# Patient Record
Sex: Female | Born: 1935 | Race: Black or African American | Hispanic: No | Smoking: Never smoker
Health system: Southern US, Community
[De-identification: ages and names within clinical notes are randomized; demographics above are authoritative.]

## PROBLEM LIST (undated history)

## (undated) DIAGNOSIS — I10 Essential (primary) hypertension: Secondary | ICD-10-CM

## (undated) HISTORY — PX: ABDOMINAL HYSTERECTOMY: SHX81

---

## 2000-10-23 ENCOUNTER — Ambulatory Visit (HOSPITAL_COMMUNITY): Admission: RE | Admit: 2000-10-23 | Discharge: 2000-10-23 | Payer: Self-pay | Admitting: Internal Medicine

## 2000-10-23 ENCOUNTER — Encounter: Payer: Self-pay | Admitting: Internal Medicine

## 2001-10-08 ENCOUNTER — Ambulatory Visit (HOSPITAL_COMMUNITY): Admission: RE | Admit: 2001-10-08 | Discharge: 2001-10-08 | Payer: Self-pay | Admitting: Ophthalmology

## 2003-06-21 ENCOUNTER — Ambulatory Visit (HOSPITAL_COMMUNITY): Admission: RE | Admit: 2003-06-21 | Discharge: 2003-06-21 | Payer: Self-pay | Admitting: Internal Medicine

## 2006-10-05 ENCOUNTER — Emergency Department (HOSPITAL_COMMUNITY): Admission: EM | Admit: 2006-10-05 | Discharge: 2006-10-05 | Payer: Self-pay | Admitting: Emergency Medicine

## 2007-03-28 ENCOUNTER — Emergency Department (HOSPITAL_COMMUNITY): Admission: EM | Admit: 2007-03-28 | Discharge: 2007-03-28 | Payer: Self-pay | Admitting: Emergency Medicine

## 2007-07-20 ENCOUNTER — Emergency Department (HOSPITAL_COMMUNITY): Admission: EM | Admit: 2007-07-20 | Discharge: 2007-07-20 | Payer: Self-pay | Admitting: Emergency Medicine

## 2008-09-23 ENCOUNTER — Encounter: Payer: Self-pay | Admitting: Orthopedic Surgery

## 2008-09-23 ENCOUNTER — Emergency Department (HOSPITAL_COMMUNITY): Admission: EM | Admit: 2008-09-23 | Discharge: 2008-09-23 | Payer: Self-pay | Admitting: Emergency Medicine

## 2008-10-23 ENCOUNTER — Encounter: Payer: Self-pay | Admitting: Orthopedic Surgery

## 2008-10-24 ENCOUNTER — Ambulatory Visit: Payer: Self-pay | Admitting: Orthopedic Surgery

## 2008-10-24 DIAGNOSIS — S92309A Fracture of unspecified metatarsal bone(s), unspecified foot, initial encounter for closed fracture: Secondary | ICD-10-CM | POA: Insufficient documentation

## 2008-11-22 ENCOUNTER — Ambulatory Visit: Payer: Self-pay | Admitting: Orthopedic Surgery

## 2010-01-03 ENCOUNTER — Emergency Department (HOSPITAL_COMMUNITY)
Admission: EM | Admit: 2010-01-03 | Discharge: 2010-01-03 | Payer: Self-pay | Source: Home / Self Care | Admitting: Emergency Medicine

## 2010-05-28 ENCOUNTER — Inpatient Hospital Stay (HOSPITAL_COMMUNITY)
Admission: EM | Admit: 2010-05-28 | Discharge: 2010-05-31 | DRG: 312 | Disposition: A | Discharge status: Home Health | Payer: Medicare Other | Attending: Internal Medicine | Admitting: Internal Medicine

## 2010-05-28 DIAGNOSIS — I10 Essential (primary) hypertension: Secondary | ICD-10-CM | POA: Diagnosis present

## 2010-05-28 DIAGNOSIS — R55 Syncope and collapse: Principal | ICD-10-CM | POA: Diagnosis present

## 2010-05-28 DIAGNOSIS — R0789 Other chest pain: Secondary | ICD-10-CM | POA: Diagnosis present

## 2010-05-28 LAB — URINALYSIS, ROUTINE W REFLEX MICROSCOPIC
Bilirubin Urine: NEGATIVE
Hgb urine dipstick: NEGATIVE
Nitrite: NEGATIVE
Protein, ur: NEGATIVE mg/dL
Urobilinogen, UA: 0.2 mg/dL (ref 0.0–1.0)

## 2010-05-28 LAB — DIFFERENTIAL
Eosinophils Absolute: 0 10*3/uL (ref 0.0–0.7)
Eosinophils Relative: 0 % (ref 0–5)
Lymphocytes Relative: 19 % (ref 12–46)
Lymphs Abs: 1.8 10*3/uL (ref 0.7–4.0)
Monocytes Absolute: 0.6 10*3/uL (ref 0.1–1.0)
Monocytes Relative: 7 % (ref 3–12)

## 2010-05-28 LAB — CBC
HCT: 35.6 % — ABNORMAL LOW (ref 36.0–46.0)
MCH: 30.9 pg (ref 26.0–34.0)
MCHC: 34.6 g/dL (ref 30.0–36.0)
MCV: 89.4 fL (ref 78.0–100.0)
Platelets: 174 10*3/uL (ref 150–400)
RDW: 13.6 % (ref 11.5–15.5)
WBC: 9.7 10*3/uL (ref 4.0–10.5)

## 2010-05-28 LAB — POCT CARDIAC MARKERS
CKMB, poc: 2 ng/mL (ref 1.0–8.0)
CKMB, poc: 1.2 ng/mL (ref 1.0–8.0)
Troponin i, poc: <0.05 ng/mL (ref 0.00–0.09)

## 2010-05-28 LAB — CARDIAC PANEL(CRET KIN+CKTOT+MB+TROPI)
CK, MB: 1.9 ng/mL (ref 0.3–4.0)
Relative Index: 0.9 (ref 0.0–2.5)
Total CK: 213 U/L — ABNORMAL HIGH (ref 7–177)

## 2010-05-28 LAB — HEPATIC FUNCTION PANEL
ALT: 13 U/L (ref 0–35)
Bilirubin, Direct: 0.1 mg/dL (ref 0.0–0.3)
Indirect Bilirubin: 0.6 mg/dL (ref 0.3–0.9)
Total Protein: 6.2 g/dL (ref 6.0–8.3)

## 2010-05-28 LAB — BASIC METABOLIC PANEL
BUN: 28 mg/dL — ABNORMAL HIGH (ref 6–23)
Creatinine, Ser: 1.26 mg/dL — ABNORMAL HIGH (ref 0.4–1.2)
GFR calc non Af Amer: 40 mL/min — ABNORMAL LOW (ref >60)
Glucose, Bld: 106 mg/dL — ABNORMAL HIGH (ref 70–99)
Potassium: 3.4 mEq/L — ABNORMAL LOW (ref 3.5–5.1)

## 2010-05-29 LAB — CARDIAC PANEL(CRET KIN+CKTOT+MB+TROPI)
CK, MB: 2.1 ng/mL (ref 0.3–4.0)
Relative Index: 0.8 (ref 0.0–2.5)
Total CK: 257 U/L — ABNORMAL HIGH (ref 7–177)
Troponin I: <0.01 ng/mL (ref 0.00–0.06)

## 2010-05-30 LAB — CBC
Hemoglobin: 10.8 g/dL — ABNORMAL LOW (ref 12.0–15.0)
MCHC: 34.5 g/dL (ref 30.0–36.0)
RBC: 3.49 MIL/uL — ABNORMAL LOW (ref 3.87–5.11)
WBC: 5 10*3/uL (ref 4.0–10.5)

## 2010-05-30 LAB — BASIC METABOLIC PANEL
Calcium: 8.6 mg/dL (ref 8.4–10.5)
Creatinine, Ser: 0.94 mg/dL (ref 0.4–1.2)
GFR calc Af Amer: >60 mL/min (ref >60)
GFR calc non Af Amer: 56 mL/min — ABNORMAL LOW (ref >60)

## 2010-05-30 LAB — DIFFERENTIAL
Basophils Absolute: 0 10*3/uL (ref 0.0–0.1)
Basophils Relative: 0 % (ref 0–1)
Monocytes Absolute: 0.4 10*3/uL (ref 0.1–1.0)
Neutro Abs: 2.3 10*3/uL (ref 1.7–7.7)
Neutrophils Relative %: 47 % (ref 43–77)

## 2010-06-07 NOTE — Consult Note (Signed)
  [name], [name]                ACCOUNT NO.:  [id number]  MEDICAL RECORD NO.:  [id number]          PATIENT TYPE:  INP  LOCATION:  A311                          FACILITY:  [facility]  PHYSICIAN:  Jiali Linney A. Gerilyn Pilgrim, M.D. DATE OF BIRTH:  May 09, 1919  DATE OF CONSULTATION: DATE OF DISCHARGE:                                CONSULTATION   REASON FOR CONSULTATION:  Syncope.  This is an 77 year old black female who has had a couple spells of syncope or near syncope.  The patient reports one event to me, but apparently when the daughter came in, there is another event which happened yesterday which was the reason why she was taken to the hospital.  The first event occurred about a week ago.  The patient reports that she was going to the bathroom at night and after getting up, she felt so lightheaded, everything became widened, and she could not see.  She subsequently fell to the ground and could not get up.  It is unclear how long she was out, possibly a few minutes.  She crawled to the telephone to get some help.  No clear focal neurological deficits. No clonic-tonic activity.  No chest pain or shortness of breath.  She did report having some headaches in the last week or so although she does not have any headaches today.  The second event was witnessed by the daughter, she was sitting at the table, eating, and became unresponsive.  Eyes were open and eyes started rolling back and started breathing heavily, fell backwards.  She did not fall to the ground, however.  She was subsequently taken to the hospital for further evaluation.  The patient appears that the patient had been amnestic to the second event and also to some parts of the initial event.  PAST MEDICAL HISTORY:  Significant for foot fracture, hypertension.  MEDICATIONS:  Lisinopril, aspirin, clonidine, hydrochlorothiazide, and calcium.  ALLERGIES:  None known.  SOCIAL HISTORY:  Currently stays by herself.  Her granddaughter  and grandson stays with her.  The patient is very active, does all her ADLs, wash her own clothes, does her own cooking, and really takes everything by herself.  No tobacco, alcohol, or illicit drug use.  PAST SURGICAL HISTORY:  Cataract extraction procedure and hysterectomy.  FAMILY HISTORY:  Unremarkable.  REVIEW OF SYSTEMS:  As stated in the HPI, otherwise unremarkable.  PHYSICAL EXAMINATION:  GENERAL:  She is a thin pleasant lady, in no acute distress. VITAL SIGNS:  Temperature 98.5, pulse 64, respirations 16, blood pressure 138/81. HEENT EVALUATION:  Neck is supple.  Head is normocephalic, atraumatic. She does have mild esotropia on the left. MENTATION:  She is awake and alert.   Dictation ended at this point.     Traven Davids A. Gerilyn Pilgrim, M.D.     KAD/MEDQ  D:  05/29/2010  T:  05/30/2010  Job:  [id number]  Electronically Signed by Beryle Beams M.D. on 06/07/2010 02:52:23 PM

## 2010-06-07 NOTE — Progress Notes (Signed)
  [name], [name]                ACCOUNT NO.:  [id number]  MEDICAL RECORD NO.:  [id number]          PATIENT TYPE:  INP  LOCATION:  A311                          FACILITY:  [facility]  PHYSICIAN:  Lev Cervone A. Gerilyn Pilgrim, M.D. DATE OF BIRTH:  November 25, 1919  DATE OF PROCEDURE: DATE OF DISCHARGE:                                PROGRESS NOTE   No new complaints are reported.  No new recurrent symptoms.  She reports ambulating in the bedroom yesterday and feels good.  Temperature 98.1, pulse of 60, respirations 20, blood pressure 115/77.  HEENT evaluation, neck is supple.  Head is normocephalic, atraumatic.  Abdomen is soft. Extremities, no significant edema.  Mentation, she is awake and alert. She is lucid and coherent.  Motor examination shows she moves all 4 extremities well.  EEG is done and essentially is unremarkable without epileptiform discharges.  ASSESSMENT AND PLAN:  Recurrent spells of altered mentation and unresponsiveness/syncope, unclear etiology.  EEG done once is normal, although this did not entirely ruled out epileptic events, continue to follow the patient.     [name] A. Gerilyn Pilgrim, M.D.     KAD/MEDQ  D:  05/30/2010  T:  05/31/2010  Job:  [id number]  Electronically Signed by Beryle Beams M.D. on 06/07/2010 02:52:32 PM

## 2010-06-07 NOTE — Consult Note (Signed)
  [name], Larson                ACCOUNT NO.:  [id number]  MEDICAL RECORD NO.:  [id number]          PATIENT TYPE:  INP  LOCATION:  A311                          FACILITY:  [facility]  PHYSICIAN:  Latiana Tomei A. Gerilyn Pilgrim, M.D. DATE OF BIRTH:  November 11, 1919  DATE OF CONSULTATION: DATE OF DISCHARGE:                                CONSULTATION   ADDENDUM  ABDOMEN:  Soft. EXTREMITIES:  No edema. NEUROLOGIC:  Mentation, she is awake and alert.  Speech, language, and cognition are intact.  Cranial nerves evaluation shows right pupils to be 6 mm and minimally reactive.  She has a dense cataract there.  The left pupil is status post iridectomy.  Irregularly shaped, somewhat oval- shaped, less reactive, however.  Extraocular movements are intact. Visual fields are full.  Facial and muscle strength is symmetric. Tongue is midline.  Uvula midline.  Shoulder shrugs normal.  Motor examination shows mild proximal leg weakness bilaterally, greater than 4+.  Distal strength is good.  Upper extremity shows normal tone, bulk, and strength.  There is no pronator drift.  Coordination shows no dysmetria.  There are no tremors.  There is no parkinsonism.  Reflexes are preserved.  Plantars are both equivocal.  Sensation normal to light touch.  CT scan of brain shows atrophy and mild leukoencephalopathy.  Chest x- ray shows mild atelectasis on the left.  Labs evaluation showed sodium 141, potassium 3.4, chloride 100, CO2 of 32, BUN 28, creatinine 1.2, glucose 106.  Liver enzymes normal.  WBC 9.7, hemoglobin 12.3, platelet count of 174.  CPK was 213 and 237, troponin 0.01, MB fraction of 1.9.  Urinalysis negative.  ASSESSMENT:  Two spells of altered mentation and syncope/presyncope, unclear etiology.  The first includes a cardiac event and possibly unusual complex partial seizure presentation.  Recommendation, She has echocardiography, carotid duplex, Doppler, and telemetry all in progress.  Also cardiology is  pending previous workup.  I am also going to have EEG and follow the patient with you.  Thank you for this consultation.     [name] A. Gerilyn Pilgrim, M.D.    KAD/MEDQ  D:  05/29/2010  T:  05/29/2010  Job:  [id number]  Electronically Signed by Beryle Beams M.D. on 06/07/2010 02:52:16 PM

## 2010-06-13 NOTE — H&P (Signed)
  [name], [name]                ACCOUNT NO.:  [id number]  MEDICAL RECORD NO.:  [id number]          PATIENT TYPE:  INP  LOCATION:  A311                          FACILITY:  [facility]  PHYSICIAN:  Json Koelzer D. Felecia Shelling, MD   DATE OF BIRTH:  [birth year]  DATE OF ADMISSION:  05/28/2010 DATE OF DISCHARGE:  LH                             HISTORY & PHYSICAL   CHIEF COMPLAINTS:  Syncopal episode.  HISTORY OF PRESENT ILLNESS:  This is an 77 year old female patient with history of hypertension and parathyroid adenoma, who came to emergency room with above complaints.  The patient passed out and fell on the ground about 2 days back when she was at home.  The patient did not remember what happened during the episode.  Later, she had some chest discomfort which lasted for a few minutes.  She had no nausea, vomiting, or diaphoresis.  REVIEW OF SYSTEMS:  No headache, fever, chills, shortness of breath, nausea, vomiting, abdominal pain, dysuria, urgency or frequency of urination.  PAST MEDICAL HISTORY: 1. Hypertension. 2. History of adenoma of parathyroid gland and status post surgery.  CURRENT MEDICATIONS: 1. Clonidine 0.1 mg p.o. b.i.d. 2. Lisinopril/HCTZ 20/12.5 one tablet p.o. daily. 3. Calcitriol oral 600 mg p.o. daily.  SOCIAL HISTORY:  The patient is a widow.  She currently lives with her children.  No history of alcohol, tobacco, or substance abuse.  FAMILY HISTORY:  There is no significant family history that the patient can remember.  PHYSICAL EXAMINATION:  GENERAL:  The patient is alert, awake, and pleasant looking. VITAL SIGNS:  Blood pressure 165/94, pulse 87, respiratory rate 16, temperature 98.8 degrees Fahrenheit. HEENT:  Pupils are equal and reactive. NECK:  Supple. CHEST:  Clear lung fields.  Good air entry. CARDIOVASCULAR SYSTEM:  First and second heart sound heard.  No murmur, no gallop. ABDOMEN:  Soft and lax.  Bowel sounds positive.  No mass  or organomegaly. EXTREMITIES:  No leg edema.  LABS ON ADMISSION:  CBC; WBC 9.7, hemoglobin 12.3, hematocrit 36.5, platelets 174.  Sodium 141, potassium 3.4, chloride 100, carbon dioxide 32, glucose 106, BUN 28, creatinine 1.2, calcium 8.9.  Total bilirubin 0.7, alkaline phosphatase 38, AST 26, ALT 13, albumin 3.3, myoglobin 244, troponin less than 0.05, and CK-MB 2.0.  ASSESSMENT: 1. Syncopal episode, etiology not clear at this time. 2. History of chest pain to rule out myocardial infarction. 3. Hypertension. 4. History of parathyroid adenoma and status post surgery.  PLAN:  We will admit the patient under telemetry.  We will continue serial EKG and cardiac enzymes.  We will do carotid Doppler, echocardiogram, and Cardiology and Neurology consult.  We will continue the patient on her regular medications.  We will monitor orthostatic blood pressure.     Renn Stille D. Felecia Shelling, MD     TDF/MEDQ  D:  05/29/2010  T:  05/29/2010  Job:  [id number]  Electronically Signed by Avon Gully MD on 06/13/2010 08:19:11 AM

## 2010-07-09 NOTE — Discharge Summary (Signed)
  [name], [name]                ACCOUNT NO.:  [id number]  MEDICAL RECORD NO.:  [id number]           PATIENT TYPE:  I  LOCATION:  A311                          FACILITY:  [facility]  PHYSICIAN:  Kennadie Brenner D. Felecia Shelling, MD   DATE OF BIRTH:  06/04/1919  DATE OF ADMISSION:  05/28/2010 DATE OF DISCHARGE:  02/02/2012LH                              DISCHARGE SUMMARY   DISCHARGE DIAGNOSES: 1. Syncopal episode. 2. Hypertension. 3. History of parathyroid adenoma and status post surgery.  DISCHARGE MEDICATIONS: 1. Clonidine 0.1 mg p.o. b.i.d. 2. Lisinopril/hydrochlorothiazide 12/12.5 one tablet p.o. daily. 3. Calcitriol oral 600 mg p.o. daily.  DISPOSITION:  The patient will be discharged to home in stable condition.  DISCHARGE INSTRUCTIONS:  The patient is advised to continue her medications.  Advised on fall precaution.  She will be followed in the office.  HOSPITAL COURSE:  This is an 77 year old female patient with history of hypertension and parathyroid adenoma brought to emergency room due to syncopal episode and accidental fall.  She was admitted under telemetry and serial EKG and cardiac enzymes were done.  The patient was evaluated by Neurology.  She had her workup.  Her labs and other tests were within the normal limits.  The patient was able to ambulate without any assistance.  She was back to her baseline.  The patient was discharged to home in a stable condition.     [name] D. Felecia Shelling, MD     TDF/MEDQ  D:  07/05/2010  T:  07/05/2010  Job:  [id number]  Electronically Signed by Avon Gully MD on 07/09/2010 08:59:27 AM

## 2011-01-21 LAB — DIFFERENTIAL
Basophils Relative: 1
Lymphocytes Relative: 45
Monocytes Absolute: 0.3
Monocytes Relative: 8
Neutro Abs: 1.9

## 2011-01-21 LAB — URINALYSIS, ROUTINE W REFLEX MICROSCOPIC
Bilirubin Urine: NEGATIVE
Hgb urine dipstick: NEGATIVE
Specific Gravity, Urine: 1.01
pH: 8

## 2011-01-21 LAB — BASIC METABOLIC PANEL
CO2: 36 — ABNORMAL HIGH
Calcium: 9.5
GFR calc Af Amer: >60
Sodium: 139

## 2011-01-21 LAB — URINE MICROSCOPIC-ADD ON

## 2011-01-21 LAB — CBC
Hemoglobin: 12.6
MCHC: 34.4
RBC: 4.03

## 2011-02-14 LAB — COMPREHENSIVE METABOLIC PANEL WITH GFR
ALT: 14
AST: 26
Albumin: 3.3 — ABNORMAL LOW
Alkaline Phosphatase: 37 — ABNORMAL LOW
BUN: 9
CO2: 35 — ABNORMAL HIGH
Calcium: 9.3
Chloride: 99
Creatinine, Ser: 0.86
GFR calc non Af Amer: >60
Glucose, Bld: 115 — ABNORMAL HIGH
Potassium: 3.1 — ABNORMAL LOW
Sodium: 138
Total Bilirubin: 0.4
Total Protein: 6.3

## 2011-02-14 LAB — DIFFERENTIAL
Basophils Relative: 1
Eosinophils Absolute: 0
Lymphs Abs: 1.7
Monocytes Absolute: 0.4
Neutrophils Relative %: 52

## 2011-02-14 LAB — CBC
HCT: 34.2 — ABNORMAL LOW
Hemoglobin: 12
MCHC: 35
MCV: 89.2
Platelets: 214
RBC: 3.84 — ABNORMAL LOW
RDW: 13
WBC: 4.4

## 2011-02-14 LAB — URINE MICROSCOPIC-ADD ON

## 2011-02-14 LAB — URINE CULTURE: Colony Count: >=100000

## 2011-02-14 LAB — URINALYSIS, ROUTINE W REFLEX MICROSCOPIC
Bilirubin Urine: NEGATIVE
Glucose, UA: NEGATIVE
Hgb urine dipstick: NEGATIVE
Specific Gravity, Urine: 1.015
Urobilinogen, UA: 0.2

## 2011-02-14 LAB — POCT CARDIAC MARKERS
CKMB, poc: 1.5
Myoglobin, poc: 59.1
Operator id: 240821
Troponin i, poc: <0.05

## 2011-04-22 ENCOUNTER — Emergency Department (HOSPITAL_COMMUNITY): Payer: Medicare Other

## 2011-04-22 ENCOUNTER — Other Ambulatory Visit: Payer: Self-pay

## 2011-04-22 ENCOUNTER — Emergency Department (HOSPITAL_COMMUNITY)
Admission: EM | Admit: 2011-04-22 | Discharge: 2011-04-22 | Disposition: A | Discharge status: Home / Self Care | Payer: Medicare Other | Attending: Emergency Medicine | Admitting: Emergency Medicine

## 2011-04-22 DIAGNOSIS — R1110 Vomiting, unspecified: Secondary | ICD-10-CM | POA: Insufficient documentation

## 2011-04-22 DIAGNOSIS — R109 Unspecified abdominal pain: Secondary | ICD-10-CM | POA: Insufficient documentation

## 2011-04-22 DIAGNOSIS — F29 Unspecified psychosis not due to a substance or known physiological condition: Secondary | ICD-10-CM | POA: Insufficient documentation

## 2011-04-22 DIAGNOSIS — R55 Syncope and collapse: Secondary | ICD-10-CM

## 2011-04-22 DIAGNOSIS — Z136 Encounter for screening for cardiovascular disorders: Secondary | ICD-10-CM | POA: Insufficient documentation

## 2011-04-22 DIAGNOSIS — Z7982 Long term (current) use of aspirin: Secondary | ICD-10-CM | POA: Insufficient documentation

## 2011-04-22 DIAGNOSIS — I10 Essential (primary) hypertension: Secondary | ICD-10-CM | POA: Insufficient documentation

## 2011-04-22 HISTORY — DX: Essential (primary) hypertension: I10

## 2011-04-22 MED ORDER — ONDANSETRON 4 MG PO TBDP
4.0000 mg | ORAL_TABLET | Freq: Once | ORAL | Status: AC
  Administered 2011-04-22: 4 mg via ORAL
  Filled 2011-04-22: qty 1

## 2011-04-22 MED ORDER — ONDANSETRON HCL 4 MG PO TABS: 4.0000 mg | ORAL_TABLET | Freq: Four times a day (QID) | ORAL | Status: AC

## 2011-04-22 NOTE — ED Provider Notes (Signed)
This chart was scribed for Ward Givens, MD by Williemae Natter. The patient was seen in room APA18/AP[facility]18 and the patient's care was started at 10:12 PM.  CSN: [id number]  Arrival date & time 04/22/11  2059   First MD Initiated Contact with Patient 04/22/11 2135      Chief Complaint  Patient presents with  . Emesis  . Weakness    (Consider location/radiation/quality/duration/timing/severity/associated sxs/prior treatment) HPI 10:12 PM [name] is a 77 y.o. female who presents to the Emergency Department .  Family states they just had Christmas dinner and patient had acute onset of vomiting. She had 2 episodes of vomiting. She was trying to walk to the bathroom to start vomiting and her daughter states she passed out for 2-3 seconds. She did not have any injury from falling. She indicates she had periumbilical, pain earlier when she had to throw up however that is now gone. She denies any diarrhea  No one else is sick at home. They report she's had syncope in the past however she was not ill with those episodes.  PCP Dr. Felecia Shelling  Past Medical History  Diagnosis Date  . Hypertension     Past Surgical History  Procedure Date  . Abdominal hysterectomy     No family history on file.  History  Substance Use Topics  . Smoking status: Never Smoker   . Smokeless tobacco: Not on file  . Alcohol Use: No   lives with grandson  OB History    Grav Para Term Preterm Abortions TAB SAB Ect Mult Living                  Review of Systems  All other systems reviewed and are negative.   10 Systems reviewed and are negative for acute change except as noted in the HPI.  Allergies  Review of patient's allergies indicates no known allergies.  Home Medications   Current Outpatient Rx  Name Route Sig Dispense Refill  . ASPIRIN EC 81 MG PO TBEC Oral Take 81 mg by mouth daily.      Marland Kitchen CALCIUM 600 + D PO Oral Take 1 tablet by mouth daily.      Marland Kitchen LISINOPRIL PO Oral Take 1 tablet by  mouth daily.        BP 106/65  Pulse 107  Temp(Src) 98.3 F (36.8 C) (Oral)  Resp 18  Ht 5\' 4"  (1.626 m)  Wt 133 lb (60.328 kg)  BMI 22.83 kg/m2  SpO2 99% Vital signs show mild tachycardia otherwise normal  Physical Exam  Nursing note and vitals reviewed. Constitutional: She appears well-developed and well-nourished. No distress.  HENT:  Head: Normocephalic and atraumatic.  Right Ear: External ear normal.  Left Ear: External ear normal.  Nose: Nose normal.  Mouth/Throat: Oropharynx is clear and moist.  Eyes: Conjunctivae are normal. Pupils are equal, round, and reactive to light.  Neck: Normal range of motion. Neck supple.  Cardiovascular: Normal rate and regular rhythm.   Pulmonary/Chest: Effort normal and breath sounds normal.  Abdominal: Soft. She exhibits no distension and no mass. There is no tenderness. There is no rebound and no guarding.       Patient has very active bowel sounds  Neurological: She is alert.       Patient appears to have some mild confusion  Skin: Skin is warm and dry.  Psychiatric: She has a normal mood and affect. Her behavior is normal.    ED Course  Procedures (including  critical care time)  Patient given Zofran ODT. When I rechecked her at 2310 she relates she's feeling much improved. However she did not want to drink and her daughter did not want her to drink before she leaves to make her she's okay.    Dg Abd Acute W/chest  04/22/2011  *RADIOLOGY REPORT*  Clinical Data: Abdominal pain and vomiting; weakness.  ACUTE ABDOMEN SERIES (ABDOMEN 2 VIEW & CHEST 1 VIEW)  Comparison: None.  Findings: Left basilar atelectasis is noted, reflecting elevation of the left hemidiaphragm.  There is no evidence of pleural effusion or pneumothorax.  The cardiomediastinal silhouette is normal in size, though shifted to the right due to the elevated left hemidiaphragm.  The stomach is filled with fluid and air.  The visualized bowel gas pattern is otherwise  unremarkable.  Stool and air are noted throughout the colon; there is no evidence of small bowel dilatation to suggest obstruction.  No free intra-abdominal air is identified on the provided upright view.  No acute osseous abnormalities are seen; the sacroiliac joints are unremarkable in appearance.  IMPRESSION:  1.  Elevation of the left hemidiaphragm; the stomach is mildly distended with fluid and air. 2.  Otherwise unremarkable bowel gas pattern; no free intra- abdominal air seen. 3.  Left basilar atelectasis; lungs otherwise grossly clear.  Original Report Authenticated By: Tonia Ghent, M.D.     Date: 04/22/2011  Rate: 89  Rhythm: normal sinus rhythm  QRS Axis: normal  Intervals: normal  ST/T Wave abnormalities: repolarization abnormality  Conduction Disutrbances:LVH  Narrative Interpretation:   Old EKG Reviewed: unchanged from 05/29/2010      I personally performed the services described in this documentation, which was scribed in my presence. The recorded information has been reviewed and considered. Devoria Albe, MD, FACEP   MDM          Ward Givens, MD 04/22/11 [phone]

## 2011-04-22 NOTE — ED Notes (Signed)
Pt brought in by family for vomiting and weakness x 2 hours.

## 2011-06-18 DIAGNOSIS — Z961 Presence of intraocular lens: Secondary | ICD-10-CM | POA: Diagnosis not present

## 2011-06-18 DIAGNOSIS — H348192 Central retinal vein occlusion, unspecified eye, stable: Secondary | ICD-10-CM | POA: Diagnosis not present

## 2011-06-18 DIAGNOSIS — H4310 Vitreous hemorrhage, unspecified eye: Secondary | ICD-10-CM | POA: Diagnosis not present

## 2011-06-19 ENCOUNTER — Encounter (INDEPENDENT_AMBULATORY_CARE_PROVIDER_SITE_OTHER): Payer: Medicare Other | Admitting: Ophthalmology

## 2011-06-19 DIAGNOSIS — H348192 Central retinal vein occlusion, unspecified eye, stable: Secondary | ICD-10-CM

## 2011-06-19 DIAGNOSIS — I10 Essential (primary) hypertension: Secondary | ICD-10-CM

## 2011-06-19 DIAGNOSIS — H4010X0 Unspecified open-angle glaucoma, stage unspecified: Secondary | ICD-10-CM | POA: Diagnosis not present

## 2011-06-19 DIAGNOSIS — H43819 Vitreous degeneration, unspecified eye: Secondary | ICD-10-CM

## 2011-06-19 DIAGNOSIS — H35039 Hypertensive retinopathy, unspecified eye: Secondary | ICD-10-CM | POA: Diagnosis not present

## 2011-06-19 DIAGNOSIS — Q120 Congenital cataract: Secondary | ICD-10-CM

## 2011-06-24 ENCOUNTER — Ambulatory Visit (INDEPENDENT_AMBULATORY_CARE_PROVIDER_SITE_OTHER): Payer: Medicare Other | Admitting: Ophthalmology

## 2011-06-24 DIAGNOSIS — H348192 Central retinal vein occlusion, unspecified eye, stable: Secondary | ICD-10-CM | POA: Diagnosis not present

## 2011-06-24 DIAGNOSIS — H35039 Hypertensive retinopathy, unspecified eye: Secondary | ICD-10-CM

## 2011-06-24 DIAGNOSIS — H4310 Vitreous hemorrhage, unspecified eye: Secondary | ICD-10-CM

## 2011-06-24 DIAGNOSIS — I10 Essential (primary) hypertension: Secondary | ICD-10-CM

## 2011-06-24 DIAGNOSIS — H3530 Unspecified macular degeneration: Secondary | ICD-10-CM

## 2011-06-24 DIAGNOSIS — H43819 Vitreous degeneration, unspecified eye: Secondary | ICD-10-CM

## 2011-07-16 ENCOUNTER — Encounter (INDEPENDENT_AMBULATORY_CARE_PROVIDER_SITE_OTHER): Payer: Medicare Other | Admitting: Ophthalmology

## 2011-07-16 DIAGNOSIS — I10 Essential (primary) hypertension: Secondary | ICD-10-CM

## 2011-07-16 DIAGNOSIS — H2510 Age-related nuclear cataract, unspecified eye: Secondary | ICD-10-CM

## 2011-07-16 DIAGNOSIS — H348192 Central retinal vein occlusion, unspecified eye, stable: Secondary | ICD-10-CM | POA: Diagnosis not present

## 2011-07-16 DIAGNOSIS — H35039 Hypertensive retinopathy, unspecified eye: Secondary | ICD-10-CM | POA: Diagnosis not present

## 2011-07-16 DIAGNOSIS — H43819 Vitreous degeneration, unspecified eye: Secondary | ICD-10-CM | POA: Diagnosis not present

## 2011-08-13 ENCOUNTER — Encounter (INDEPENDENT_AMBULATORY_CARE_PROVIDER_SITE_OTHER): Payer: Medicare Other | Admitting: Ophthalmology

## 2011-08-13 DIAGNOSIS — H3530 Unspecified macular degeneration: Secondary | ICD-10-CM | POA: Diagnosis not present

## 2011-08-13 DIAGNOSIS — I10 Essential (primary) hypertension: Secondary | ICD-10-CM

## 2011-08-13 DIAGNOSIS — H43819 Vitreous degeneration, unspecified eye: Secondary | ICD-10-CM

## 2011-08-13 DIAGNOSIS — H348192 Central retinal vein occlusion, unspecified eye, stable: Secondary | ICD-10-CM

## 2011-08-13 DIAGNOSIS — H35039 Hypertensive retinopathy, unspecified eye: Secondary | ICD-10-CM

## 2011-08-26 DIAGNOSIS — I10 Essential (primary) hypertension: Secondary | ICD-10-CM | POA: Diagnosis not present

## 2011-09-09 ENCOUNTER — Encounter (INDEPENDENT_AMBULATORY_CARE_PROVIDER_SITE_OTHER): Payer: Medicare Other | Admitting: Ophthalmology

## 2011-09-09 DIAGNOSIS — I10 Essential (primary) hypertension: Secondary | ICD-10-CM

## 2011-09-09 DIAGNOSIS — H43819 Vitreous degeneration, unspecified eye: Secondary | ICD-10-CM

## 2011-09-09 DIAGNOSIS — H35039 Hypertensive retinopathy, unspecified eye: Secondary | ICD-10-CM

## 2011-09-09 DIAGNOSIS — H348192 Central retinal vein occlusion, unspecified eye, stable: Secondary | ICD-10-CM | POA: Diagnosis not present

## 2011-09-09 DIAGNOSIS — H2510 Age-related nuclear cataract, unspecified eye: Secondary | ICD-10-CM

## 2011-10-07 ENCOUNTER — Encounter (INDEPENDENT_AMBULATORY_CARE_PROVIDER_SITE_OTHER): Payer: Medicare Other | Admitting: Ophthalmology

## 2011-10-07 DIAGNOSIS — H348192 Central retinal vein occlusion, unspecified eye, stable: Secondary | ICD-10-CM | POA: Diagnosis not present

## 2011-10-07 DIAGNOSIS — H35039 Hypertensive retinopathy, unspecified eye: Secondary | ICD-10-CM | POA: Diagnosis not present

## 2011-10-07 DIAGNOSIS — H2510 Age-related nuclear cataract, unspecified eye: Secondary | ICD-10-CM

## 2011-10-07 DIAGNOSIS — H43819 Vitreous degeneration, unspecified eye: Secondary | ICD-10-CM | POA: Diagnosis not present

## 2011-10-07 DIAGNOSIS — I10 Essential (primary) hypertension: Secondary | ICD-10-CM

## 2011-11-11 ENCOUNTER — Encounter (INDEPENDENT_AMBULATORY_CARE_PROVIDER_SITE_OTHER): Payer: Medicare Other | Admitting: Ophthalmology

## 2011-11-11 DIAGNOSIS — I10 Essential (primary) hypertension: Secondary | ICD-10-CM

## 2011-11-11 DIAGNOSIS — H2510 Age-related nuclear cataract, unspecified eye: Secondary | ICD-10-CM

## 2011-11-11 DIAGNOSIS — H348192 Central retinal vein occlusion, unspecified eye, stable: Secondary | ICD-10-CM

## 2011-11-11 DIAGNOSIS — H35039 Hypertensive retinopathy, unspecified eye: Secondary | ICD-10-CM

## 2011-11-11 DIAGNOSIS — H43819 Vitreous degeneration, unspecified eye: Secondary | ICD-10-CM

## 2011-11-18 DIAGNOSIS — E039 Hypothyroidism, unspecified: Secondary | ICD-10-CM | POA: Diagnosis not present

## 2011-11-18 DIAGNOSIS — R799 Abnormal finding of blood chemistry, unspecified: Secondary | ICD-10-CM | POA: Diagnosis not present

## 2011-11-18 DIAGNOSIS — IMO0002 Reserved for concepts with insufficient information to code with codable children: Secondary | ICD-10-CM | POA: Diagnosis not present

## 2011-11-18 DIAGNOSIS — I10 Essential (primary) hypertension: Secondary | ICD-10-CM | POA: Diagnosis not present

## 2011-11-18 DIAGNOSIS — I739 Peripheral vascular disease, unspecified: Secondary | ICD-10-CM | POA: Diagnosis not present

## 2011-11-18 DIAGNOSIS — R7309 Other abnormal glucose: Secondary | ICD-10-CM | POA: Diagnosis not present

## 2011-11-18 DIAGNOSIS — R55 Syncope and collapse: Secondary | ICD-10-CM | POA: Diagnosis not present

## 2011-11-25 DIAGNOSIS — I739 Peripheral vascular disease, unspecified: Secondary | ICD-10-CM | POA: Diagnosis not present

## 2011-11-25 DIAGNOSIS — I10 Essential (primary) hypertension: Secondary | ICD-10-CM | POA: Diagnosis not present

## 2011-12-16 ENCOUNTER — Encounter (INDEPENDENT_AMBULATORY_CARE_PROVIDER_SITE_OTHER): Payer: Medicare Other | Admitting: Ophthalmology

## 2011-12-16 DIAGNOSIS — I10 Essential (primary) hypertension: Secondary | ICD-10-CM

## 2011-12-16 DIAGNOSIS — H348192 Central retinal vein occlusion, unspecified eye, stable: Secondary | ICD-10-CM

## 2011-12-16 DIAGNOSIS — H43819 Vitreous degeneration, unspecified eye: Secondary | ICD-10-CM

## 2011-12-16 DIAGNOSIS — H2510 Age-related nuclear cataract, unspecified eye: Secondary | ICD-10-CM

## 2011-12-16 DIAGNOSIS — H3410 Central retinal artery occlusion, unspecified eye: Secondary | ICD-10-CM

## 2011-12-16 DIAGNOSIS — H35039 Hypertensive retinopathy, unspecified eye: Secondary | ICD-10-CM

## 2012-01-13 ENCOUNTER — Encounter (HOSPITAL_COMMUNITY): Payer: Self-pay | Admitting: Emergency Medicine

## 2012-01-13 ENCOUNTER — Emergency Department (HOSPITAL_COMMUNITY): Payer: Medicare Other

## 2012-01-13 ENCOUNTER — Inpatient Hospital Stay (HOSPITAL_COMMUNITY): Payer: Medicare Other

## 2012-01-13 ENCOUNTER — Inpatient Hospital Stay (HOSPITAL_COMMUNITY)
Admission: EM | Admit: 2012-01-13 | Discharge: 2012-01-15 | DRG: 534 | Disposition: A | Discharge status: Home Health | Payer: Medicare Other | Attending: Orthopedic Surgery | Admitting: Orthopedic Surgery

## 2012-01-13 DIAGNOSIS — Z043 Encounter for examination and observation following other accident: Secondary | ICD-10-CM | POA: Diagnosis not present

## 2012-01-13 DIAGNOSIS — Z7982 Long term (current) use of aspirin: Secondary | ICD-10-CM

## 2012-01-13 DIAGNOSIS — W19XXXA Unspecified fall, initial encounter: Secondary | ICD-10-CM | POA: Diagnosis present

## 2012-01-13 DIAGNOSIS — S72413A Displaced unspecified condyle fracture of lower end of unspecified femur, initial encounter for closed fracture: Secondary | ICD-10-CM | POA: Diagnosis not present

## 2012-01-13 DIAGNOSIS — I10 Essential (primary) hypertension: Secondary | ICD-10-CM | POA: Diagnosis not present

## 2012-01-13 DIAGNOSIS — Z79899 Other long term (current) drug therapy: Secondary | ICD-10-CM

## 2012-01-13 DIAGNOSIS — Y92009 Unspecified place in unspecified non-institutional (private) residence as the place of occurrence of the external cause: Secondary | ICD-10-CM

## 2012-01-13 DIAGNOSIS — S7291XA Unspecified fracture of right femur, initial encounter for closed fracture: Secondary | ICD-10-CM

## 2012-01-13 DIAGNOSIS — S7290XA Unspecified fracture of unspecified femur, initial encounter for closed fracture: Secondary | ICD-10-CM

## 2012-01-13 LAB — BASIC METABOLIC PANEL
BUN: 17 mg/dL (ref 6–23)
Calcium: 10 mg/dL (ref 8.4–10.5)
Chloride: 100 mEq/L (ref 96–112)
Creatinine, Ser: 1.09 mg/dL (ref 0.50–1.10)
GFR calc Af Amer: 50 mL/min — ABNORMAL LOW (ref >90)

## 2012-01-13 LAB — CBC WITH DIFFERENTIAL/PLATELET
Basophils Absolute: 0 10*3/uL (ref 0.0–0.1)
Basophils Relative: 0 % (ref 0–1)
Eosinophils Relative: 0 % (ref 0–5)
HCT: 41.4 % (ref 36.0–46.0)
Hemoglobin: 13.6 g/dL (ref 12.0–15.0)
MCH: 29.5 pg (ref 26.0–34.0)
MCHC: 32.9 g/dL (ref 30.0–36.0)
MCV: 89.8 fL (ref 78.0–100.0)
Monocytes Absolute: 0.4 10*3/uL (ref 0.1–1.0)
Monocytes Relative: 6 % (ref 3–12)
Neutro Abs: 5 10*3/uL (ref 1.7–7.7)
RDW: 13.6 % (ref 11.5–15.5)

## 2012-01-13 LAB — PROTIME-INR
INR: 1.09 (ref 0.00–1.49)
Prothrombin Time: 14.3 seconds (ref 11.6–15.2)

## 2012-01-13 MED ORDER — ACETAMINOPHEN 500 MG PO TABS
500.0000 mg | ORAL_TABLET | Freq: Four times a day (QID) | ORAL | Status: DC | PRN
  Administered 2012-01-14 – 2012-01-15 (×2): 500 mg via ORAL
  Filled 2012-01-13 (×3): qty 1

## 2012-01-13 MED ORDER — SENNOSIDES-DOCUSATE SODIUM 8.6-50 MG PO TABS: 1.0000 | ORAL_TABLET | Freq: Every evening | ORAL | Status: DC | PRN

## 2012-01-13 MED ORDER — SENNA 8.6 MG PO TABS
1.0000 | ORAL_TABLET | Freq: Two times a day (BID) | ORAL | Status: DC
  Administered 2012-01-13 – 2012-01-14 (×4): 8.6 mg via ORAL
  Filled 2012-01-13 (×3): qty 1

## 2012-01-13 MED ORDER — DOCUSATE SODIUM 100 MG PO CAPS
100.0000 mg | ORAL_CAPSULE | Freq: Two times a day (BID) | ORAL | Status: DC
  Administered 2012-01-13 – 2012-01-14 (×4): 100 mg via ORAL
  Filled 2012-01-13 (×3): qty 1

## 2012-01-13 MED ORDER — MORPHINE SULFATE 2 MG/ML IJ SOLN: 0.5000 mg | INTRAMUSCULAR | Status: DC | PRN

## 2012-01-13 MED ORDER — FLEET ENEMA 7-19 GM/118ML RE ENEM: 1.0000 | ENEMA | Freq: Once | RECTAL | Status: AC | PRN

## 2012-01-13 MED ORDER — HYDROCODONE-ACETAMINOPHEN 5-325 MG PO TABS
1.0000 | ORAL_TABLET | Freq: Four times a day (QID) | ORAL | Status: DC | PRN
  Administered 2012-01-13: 1 via ORAL
  Filled 2012-01-13: qty 1

## 2012-01-13 MED ORDER — ENOXAPARIN SODIUM 40 MG/0.4ML ~~LOC~~ SOLN
40.0000 mg | Freq: Once | SUBCUTANEOUS | Status: AC
  Administered 2012-01-13: 40 mg via SUBCUTANEOUS
  Filled 2012-01-13: qty 0.4

## 2012-01-13 MED ORDER — ACETAMINOPHEN 325 MG PO TABS
650.0000 mg | ORAL_TABLET | Freq: Once | ORAL | Status: AC
  Administered 2012-01-13: 650 mg via ORAL
  Filled 2012-01-13: qty 2

## 2012-01-13 NOTE — ED Provider Notes (Signed)
History     CSN: [id number]  Arrival date & time 01/13/12  4098   First MD Initiated Contact with Patient 01/13/12 [phone]      Chief Complaint  Patient presents with  . Knee Pain  . Fall    (Consider location/radiation/quality/duration/timing/severity/associated sxs/prior treatment) HPI HX per PT.  Lives at home with her grandson.  She got up this am, made breakfast and was standing up.  She heard the dryer turn off and turned to walk to it and her R knee gave out.  She fell and was unable to get up.  She called her son. She denies striking her head or hurting her neck. No other pain, injury or trauma. She has right knee pain with movement, but no discomfort while at rest.   No hip pain, no weakness or numbness. No bleeding/ blood loss. She declines any pain medications. Pain is sharp, not radiating. No swelling.   Past Medical History  Diagnosis Date  . Hypertension     Past Surgical History  Procedure Date  . Abdominal hysterectomy     History reviewed. No pertinent family history.  History  Substance Use Topics  . Smoking status: Never Smoker   . Smokeless tobacco: Not on file  . Alcohol Use: No    OB History    Grav Para Term Preterm Abortions TAB SAB Ect Mult Living                  Review of Systems  Constitutional: Negative for fever and chills.  HENT: Negative for neck pain and neck stiffness.   Eyes: Negative for pain.  Respiratory: Negative for shortness of breath.   Cardiovascular: Negative for chest pain.  Gastrointestinal: Negative for abdominal pain.  Genitourinary: Negative for dysuria.  Musculoskeletal: Negative for back pain.  Skin: Negative for rash.  Neurological: Negative for headaches.  All other systems reviewed and are negative.    Allergies  Review of patient's allergies indicates no known allergies.  Home Medications   Current Outpatient Rx  Name Route Sig Dispense Refill  . ASPIRIN EC 81 MG PO TBEC Oral Take 81 mg by mouth  daily.      Marland Kitchen CALCIUM 600 + D PO Oral Take 1 tablet by mouth daily.      Marland Kitchen LISINOPRIL PO Oral Take 1 tablet by mouth daily.        BP 136/63  Pulse 86  Temp 98.4 F (36.9 C) (Oral)  Resp 18  Ht 5\' 1"  (1.549 m)  Wt 130 lb (58.968 kg)  BMI 24.56 kg/m2  SpO2 98%  Physical Exam  Constitutional: She is oriented to person, place, and time. She appears well-developed and well-nourished.  HENT:  Head: Normocephalic and atraumatic.  Eyes: Conjunctivae normal and EOM are normal. Pupils are equal, round, and reactive to light.  Neck: Trachea normal. Neck supple.       No midline cervical tenderness  Cardiovascular: Normal rate, regular rhythm, S1 normal, S2 normal and normal pulses.     No systolic murmur is present   No diastolic murmur is present  Pulses:      Radial pulses are 2+ on the right side, and 2+ on the left side.  Pulmonary/Chest: Effort normal and breath sounds normal. She has no wheezes. She has no rhonchi. She has no rales. She exhibits no tenderness.  Abdominal: Soft. Normal appearance and bowel sounds are normal. There is no tenderness. There is no CVA tenderness and negative Murphy's sign.  Musculoskeletal:       RLE: localizes tenderness to right knee with mild effusion. Skin intact without obvious deformity. Dec ROM at the knee 2/2 pain, no obvious joint instability. nontender over hip with hip flexion intact. Pelvis stable. nontender ankle and foot. Distal N/V intact with equal pulses. No extremity tenderness or deformity otherwise.   Neurological: She is alert and oriented to person, place, and time. She has normal strength. No cranial nerve deficit or sensory deficit. GCS eye subscore is 4. GCS verbal subscore is 5. GCS motor subscore is 6.  Skin: Skin is warm and dry. No rash noted. She is not diaphoretic.  Psychiatric: Her speech is normal.       Cooperative and appropriate    ED Course  Procedures (including critical care time)  Ice. Xray ordered. Tylenol.    Imaging pending and PT care transferred to [facility]  MDM   nursing notes and VS reviewed. Imaging obtained. Ice and medications provided.         Sunnie Nielsen, MD 01/13/12 [phone]

## 2012-01-13 NOTE — H&P (Signed)
[name] is an 78 y.o. female.   Chief Complaint: Pain right knee HPI: This is a 78 year old female who lives at home with a nephew and performs some of the routine activities of daily living including cooking for herself and during her own laundry who presents with acute fracture of the right distal femur. The patient was up this morning making her breakfast she made a sudden turn to go to the laundry room in her right leg gave way and she was unable to ambulate. She was brought into the emergency room for a workup which revealed a distal right intra-articular medial femoral condyle fracture minimal displacement.  She is admitted for further evaluation and treatment.  Past Medical History  Diagnosis Date  . Hypertension     Past Surgical History  Procedure Date  . Abdominal hysterectomy     History reviewed. No pertinent family history. Social History:  reports that she has never smoked. She does not have any smokeless tobacco history on file. She reports that she does not drink alcohol or use illicit drugs.  Allergies: No Known Allergies   (Not in a hospital admission)  Results for orders placed during the hospital encounter of 01/13/12 (from the past 48 hour(s))  CBC WITH DIFFERENTIAL     Status: Normal   Collection Time   01/13/12  8:26 AM      Component Value Range Comment   WBC 7.2  4.0 - 10.5 K/uL    RBC 4.61  3.87 - 5.11 MIL/uL    Hemoglobin 13.6  12.0 - 15.0 g/dL    HCT 16.1  09.6 - 04.5 %    MCV 89.8  78.0 - 100.0 fL    MCH 29.5  26.0 - 34.0 pg    MCHC 32.9  30.0 - 36.0 g/dL    RDW 40.9  81.1 - 91.4 %    Platelets 177  150 - 400 K/uL    Neutrophils Relative 68  43 - 77 %    Neutro Abs 5.0  1.7 - 7.7 K/uL    Lymphocytes Relative 26  12 - 46 %    Lymphs Abs 1.9  0.7 - 4.0 K/uL    Monocytes Relative 6  3 - 12 %    Monocytes Absolute 0.4  0.1 - 1.0 K/uL    Eosinophils Relative 0  0 - 5 %    Eosinophils Absolute 0.0  0.0 - 0.7 K/uL    Basophils Relative 0  0 -  1 %    Basophils Absolute 0.0  0.0 - 0.1 K/uL   BASIC METABOLIC PANEL     Status: Abnormal   Collection Time   01/13/12  8:26 AM      Component Value Range Comment   Sodium 140  135 - 145 mEq/L    Potassium 3.8  3.5 - 5.1 mEq/L    Chloride 100  96 - 112 mEq/L    CO2 29  19 - 32 mEq/L    Glucose, Bld 111 (*) 70 - 99 mg/dL    BUN 17  6 - 23 mg/dL    Creatinine, Ser 7.82  0.50 - 1.10 mg/dL    Calcium 95.6  8.4 - 10.5 mg/dL    GFR calc non Af Amer 43 (*) >90 mL/min    GFR calc Af Amer 50 (*) >90 mL/min   ALBUMIN     Status: Normal   Collection Time   01/13/12  8:39 AM  Component Value Range Comment   Albumin 3.7  3.5 - 5.2 g/dL   PROTIME-INR     Status: Normal   Collection Time   01/13/12  8:39 AM      Component Value Range Comment   Prothrombin Time 14.3  11.6 - 15.2 seconds    INR 1.09  0.00 - 1.49    Dg Chest Port 1 View  01/13/2012  *RADIOLOGY REPORT*  Clinical Data: Larey Seat.  Right femur fracture.  PORTABLE CHEST - 1 VIEW  Comparison: 05/28/2010.  Findings: The cardiac silhouette, mediastinal and hilar contours are stable.  There is tortuosity and calcification of the thoracic aorta.  Prominent paratracheal shadows likely due to ectatic vasculature and/or thyroid goiter.  The lungs are clear.  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  The bony thorax is intact.  IMPRESSION:  1.  No acute cardiopulmonary findings. 2.  Stable prominent paratracheal soft tissue density, likely ectatic vasculature or thyroid goiter. 3.  Stable eventration of the left hemidiaphragm.   Original Report Authenticated By: P. Loralie Champagne, M.D.    Dg Knee Complete 4 Views Right  01/13/2012  *RADIOLOGY REPORT*  Clinical Data: Larey Seat.  Right knee pain.  RIGHT KNEE - COMPLETE 4+ VIEW  Comparison: None  Findings: There is an oblique coursing supracondylar femur fracture medially, extending down into the intercondylar notch.  Minimal displacement.  The tibia and fibula are intact.  Chondrocalcinosis is  noted.  Popliteal artery calcifications are noted.  IMPRESSION:  Minimally displaced intra-articular fracture involving the medial femoral condyle.   Original Report Authenticated By: P. Loralie Champagne, M.D.     Review of Systems  Musculoskeletal: Positive for joint pain and falls.  All other systems reviewed and are negative.    Blood pressure 154/79, pulse 78, temperature 98.4 F (36.9 C), temperature source Oral, resp. rate 18, height 5\' 1"  (1.549 m), weight 58.968 kg (130 lb), SpO2 94.00%. Physical Exam  Constitutional: She is oriented to person, place, and time. She appears well-developed and well-nourished.  HENT:  Head: Normocephalic and atraumatic.  Eyes: Pupils are equal, round, and reactive to light.  Neck: Normal range of motion.  Cardiovascular: Normal rate and intact distal pulses.   Respiratory: Effort normal.  GI: Soft. She exhibits no distension.  Musculoskeletal:       Right hip: Normal.       Left hip: Normal.       Right knee: She exhibits decreased range of motion, swelling, effusion, abnormal alignment and bony tenderness. She exhibits no ecchymosis, no deformity, no laceration and no erythema. tenderness found. Medial joint line tenderness noted.       Left knee: Normal.       Right ankle: Normal.       Left ankle: Normal.       Upper extremity exam  Inspection and palpation revealed no abnormalities in the upper extremities.  Range of motion is full without contracture.  Motor exam is normal with grade 5 strength.  The joints are fully reduced without subluxation.  There is no atrophy or tremor and muscle tone is normal.  All joints are stable.   The right lower chest remedy appears to be in chronic comparison with additional recent fracture. The family members cannot recall whether the leg has been bowed or not.   Neurological: She is alert and oriented to person, place, and time. She exhibits normal muscle tone.  Skin: Skin is warm and dry.    Psychiatric: She has a normal  mood and affect. Her behavior is normal. Judgment and thought content normal.     Assessment/Plan This 78 year old female who is fairly independent household and community ambulator at times presents with a nondisplaced or minimally displaced medial femoral condyle fracture. After discussing the surgical and nonsurgical options she and her family has opted for nonsurgical treatment with a long-leg brace for 6 weeks followed by hinged knee brace for 6 weeks with x-rays every 2 weeks to determine fracture stability  We will start physical therapy tomorrow he will be nonweightbearing in the right leg knee immobilizer and can begin discharge planning for Wednesday.  Fuller Canada 01/13/2012, 10:03 AM

## 2012-01-13 NOTE — Progress Notes (Signed)
Dr. Romeo Apple notified of unable to insert foley cath. , attempts by 3 different people, and of pt. Being able to use bedpan.

## 2012-01-13 NOTE — Progress Notes (Signed)
DR. Romeo Apple notified of temp. New orders received.

## 2012-01-13 NOTE — ED Provider Notes (Addendum)
Date: 01/13/2012  Rate: 74  Rhythm: normal sinus rhythm  QRS Axis: normal  Intervals: normal  ST/T Wave abnormalities: nonspecific ST/T changes  Conduction Disutrbances:none  Narrative Interpretation:   Old EKG Reviewed: unchanged Unchanged from 04/22/11  Results for orders placed during the hospital encounter of 05/28/10  DIFFERENTIAL      Component Value Range   Neutrophils Relative 74  43 - 77 %   Neutro Abs 7.2  1.7 - 7.7 K/uL   Lymphocytes Relative 19  12 - 46 %   Lymphs Abs 1.8  0.7 - 4.0 K/uL   Monocytes Relative 7  3 - 12 %   Monocytes Absolute 0.6  0.1 - 1.0 K/uL   Eosinophils Relative 0  0 - 5 %   Eosinophils Absolute 0.0  0.0 - 0.7 K/uL   Basophils Relative 0  0 - 1 %   Basophils Absolute 0.0  0.0 - 0.1 K/uL  CBC      Component Value Range   WBC 9.7  4.0 - 10.5 K/uL   RBC 3.98  3.87 - 5.11 MIL/uL   Hemoglobin 12.3  12.0 - 15.0 g/dL   HCT 78.2 (*) 95.6 - 21.3 %   MCV 89.4  78.0 - 100.0 fL   MCH 30.9  26.0 - 34.0 pg   MCHC 34.6  30.0 - 36.0 g/dL   RDW 08.6  57.8 - 46.9 %   Platelets 174  150 - 400 K/uL  BASIC METABOLIC PANEL      Component Value Range   Sodium 141  135 - 145 mEq/L   Potassium 3.4 (*) 3.5 - 5.1 mEq/L   Chloride 100  96 - 112 mEq/L   CO2 32  19 - 32 mEq/L   Glucose, Bld 106 (*) 70 - 99 mg/dL   BUN 28 (*) 6 - 23 mg/dL   Creatinine, Ser 6.29 (*) 0.4 - 1.2 mg/dL   Calcium 8.9  8.4 - 52.8 mg/dL   GFR calc non Af Amer 40 (*) >60 mL/min   GFR calc Af Amer   (*) >60 mL/min   Value: 48            The eGFR has been calculated     using the MDRD equation.     This calculation has not been     validated in all clinical     situations.     eGFR's persistently     <60 mL/min signify     possible Chronic Kidney Disease.  HEPATIC FUNCTION PANEL      Component Value Range   Total Protein 6.2  6.0 - 8.3 g/dL   Albumin 3.3 (*) 3.5 - 5.2 g/dL   AST 26  0 - 37 U/L   ALT 13  0 - 35 U/L   Alkaline Phosphatase 38 (*) 39 - 117 U/L   Total Bilirubin 0.7   0.3 - 1.2 mg/dL   Bilirubin, Direct 0.1  0.0 - 0.3 mg/dL   Indirect Bilirubin 0.6  0.3 - 0.9 mg/dL  POCT CARDIAC MARKERS      Component Value Range   Myoglobin, poc 244 (*) 12 - 200 ng/mL   CKMB, poc 2.0  1.0 - 8.0 ng/mL   Troponin i, poc <0.05  0.00 - 0.09 ng/mL   Comment       Value:            TROPONIN VALUES IN THE RANGE     OF 0.00-0.09 ng/mL SHOW  NO INDICATION OF     MYOCARDIAL INJURY.                PERSISTENTLY INCREASED TROPONIN     VALUES IN THE RANGE OF 0.10-0.24     ng/mL CAN BE SEEN IN:           -UNSTABLE ANGINA           -CONGESTIVE HEART FAILURE           -MYOCARDITIS           -CHEST TRAUMA           -ARRYHTHMIAS           -LATE PRESENTING MI           -COPD       CLINICAL FOLLOW-UP RECOMMENDED.                TROPONIN VALUES >=0.25 ng/mL     INDICATE POSSIBLE MYOCARDIAL     ISCHEMIA. SERIAL TESTING     RECOMMENDED.  POCT CARDIAC MARKERS      Component Value Range   Myoglobin, poc 264 (*) 12 - 200 ng/mL   CKMB, poc 1.2  1.0 - 8.0 ng/mL   Troponin i, poc <0.05  0.00 - 0.09 ng/mL   Comment       Value:            TROPONIN VALUES IN THE RANGE     OF 0.00-0.09 ng/mL SHOW     NO INDICATION OF     MYOCARDIAL INJURY.                PERSISTENTLY INCREASED TROPONIN     VALUES IN THE RANGE OF 0.10-0.24     ng/mL CAN BE SEEN IN:           -UNSTABLE ANGINA           -CONGESTIVE HEART FAILURE           -MYOCARDITIS           -CHEST TRAUMA           -ARRYHTHMIAS           -LATE PRESENTING MI           -COPD       CLINICAL FOLLOW-UP RECOMMENDED.                TROPONIN VALUES >=0.25 ng/mL     INDICATE POSSIBLE MYOCARDIAL     ISCHEMIA. SERIAL TESTING     RECOMMENDED.  CARDIAC PANEL(CRET KIN+CKTOT+MB+TROPI)      Component Value Range   Total CK 213 (*) 7 - 177 U/L   CK, MB 1.9  0.3 - 4.0 ng/mL   Troponin I    0.00 - 0.06 ng/mL   Value: <0.01            NO INDICATION OF     MYOCARDIAL INJURY.   Relative Index 0.9  0.0 - 2.5  URINALYSIS, ROUTINE W  REFLEX MICROSCOPIC      Component Value Range   Color, Urine YELLOW  YELLOW   APPearance CLOUDY (*) CLEAR   Specific Gravity, Urine 1.010  1.005 - 1.030   pH 6.5  5.0 - 8.0   Urine Glucose, Fasting NEGATIVE  NEGATIVE mg/dL   Hgb urine dipstick NEGATIVE  NEGATIVE   Bilirubin Urine NEGATIVE  NEGATIVE   Ketones, ur TRACE (*) NEGATIVE mg/dL   Protein, ur NEGATIVE  NEGATIVE mg/dL   Urobilinogen, UA 0.2  0.0 - 1.0 mg/dL   Nitrite NEGATIVE  NEGATIVE   Leukocytes, UA    NEGATIVE   Value: NEGATIVE MICROSCOPIC NOT DONE ON URINES WITH NEGATIVE PROTEIN, BLOOD, LEUKOCYTES, NITRITE, OR GLUCOSE <1000 mg/dL.  CARDIAC PANEL(CRET KIN+CKTOT+MB+TROPI)      Component Value Range   Total CK 237 (*) 7 - 177 U/L   CK, MB 1.9  0.3 - 4.0 ng/mL   Troponin I    0.00 - 0.06 ng/mL   Value: 0.01            NO INDICATION OF     MYOCARDIAL INJURY.   Relative Index 0.8  0.0 - 2.5  CARDIAC PANEL(CRET KIN+CKTOT+MB+TROPI)      Component Value Range   Total CK 257 (*) 7 - 177 U/L   CK, MB 2.1  0.3 - 4.0 ng/mL   Troponin I    0.00 - 0.06 ng/mL   Value: <0.01            NO INDICATION OF     MYOCARDIAL INJURY.   Relative Index 0.8  0.0 - 2.5  BASIC METABOLIC PANEL      Component Value Range   Sodium 143  135 - 145 mEq/L   Potassium 3.2 (*) 3.5 - 5.1 mEq/L   Chloride 107  96 - 112 mEq/L   CO2 29  19 - 32 mEq/L   Glucose, Bld 95  70 - 99 mg/dL   BUN 17 DELTA CHECK NOTED  6 - 23 mg/dL   Creatinine, Ser 1.61  0.4 - 1.2 mg/dL   Calcium 8.6  8.4 - 09.6 mg/dL   GFR calc non Af Amer 56 (*) >60 mL/min   GFR calc Af Amer    >60 mL/min   Value: >60            The eGFR has been calculated     using the MDRD equation.     This calculation has not been     validated in all clinical     situations.     eGFR's persistently     <60 mL/min signify     possible Chronic Kidney Disease.  DIFFERENTIAL      Component Value Range   Neutrophils Relative 47  43 - 77 %   Neutro Abs 2.3  1.7 - 7.7 K/uL   Lymphocytes Relative  42  12 - 46 %   Lymphs Abs 2.1  0.7 - 4.0 K/uL   Monocytes Relative 8  3 - 12 %   Monocytes Absolute 0.4  0.1 - 1.0 K/uL   Eosinophils Relative 3  0 - 5 %   Eosinophils Absolute 0.2  0.0 - 0.7 K/uL   Basophils Relative 0  0 - 1 %   Basophils Absolute 0.0  0.0 - 0.1 K/uL  CBC      Component Value Range   WBC 5.0  4.0 - 10.5 K/uL   RBC 3.49 (*) 3.87 - 5.11 MIL/uL   Hemoglobin 10.8 (*) 12.0 - 15.0 g/dL   HCT 04.5 (*) 40.9 - 81.1 %   MCV 89.7  78.0 - 100.0 fL   MCH 30.9  26.0 - 34.0 pg   MCHC 34.5  30.0 - 36.0 g/dL   RDW 91.4  78.2 - 95.6 %   Platelets 147 (*) 150 - 400 K/uL      Cheyenne Jakes, MD 01/13/12 [phone]  Patient seen by Dr. Dierdre Highman will require admission admission arranged with Dr. Romeo Apple from orthopedics.  Right knee immobilizer placed basic admission labs ordered. They're still pending. EKG from today as noted above previous labs have been not brought in to the note.   Cheyenne Jakes, MD 01/13/12 0827  Results for orders placed during the hospital encounter of 01/13/12  BASIC METABOLIC PANEL      Component Value Range   Sodium 140  135 - 145 mEq/L   Potassium 3.8  3.5 - 5.1 mEq/L   Chloride 100  96 - 112 mEq/L   CO2 29  19 - 32 mEq/L   Glucose, Bld 111 (*) 70 - 99 mg/dL   BUN 17  6 - 23 mg/dL   Creatinine, Ser 1.61  0.50 - 1.10 mg/dL   Calcium 09.6  8.4 - 04.5 mg/dL   GFR calc non Af Amer 43 (*) >90 mL/min   GFR calc Af Amer 50 (*) >90 mL/min     Results for orders placed during the hospital encounter of 01/13/12  BASIC METABOLIC PANEL      Component Value Range   Sodium 140  135 - 145 mEq/L   Potassium 3.8  3.5 - 5.1 mEq/L   Chloride 100  96 - 112 mEq/L   CO2 29  19 - 32 mEq/L   Glucose, Bld 111 (*) 70 - 99 mg/dL   BUN 17  6 - 23 mg/dL   Creatinine, Ser 4.09  0.50 - 1.10 mg/dL   Calcium 81.1  8.4 - 91.4 mg/dL   GFR calc non Af Amer 43 (*) >90 mL/min   GFR calc Af Amer 50 (*) >90 mL/min   Dg Chest Port 1 View  01/13/2012  *RADIOLOGY REPORT*   Clinical Data: Larey Seat.  Right femur fracture.  PORTABLE CHEST - 1 VIEW  Comparison: 05/28/2010.  Findings: The cardiac silhouette, mediastinal and hilar contours are stable.  There is tortuosity and calcification of the thoracic aorta.  Prominent paratracheal shadows likely due to ectatic vasculature and/or thyroid goiter.  The lungs are clear.  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  The bony thorax is intact.  IMPRESSION:  1.  No acute cardiopulmonary findings. 2.  Stable prominent paratracheal soft tissue density, likely ectatic vasculature or thyroid goiter. 3.  Stable eventration of the left hemidiaphragm.   Original Report Authenticated By: P. Loralie Champagne, M.D.    Dg Knee Complete 4 Views Right  01/13/2012  *RADIOLOGY REPORT*  Clinical Data: Larey Seat.  Right knee pain.  RIGHT KNEE - COMPLETE 4+ VIEW  Comparison: None  Findings: There is an oblique coursing supracondylar femur fracture medially, extending down into the intercondylar notch.  Minimal displacement.  The tibia and fibula are intact.  Chondrocalcinosis is noted.  Popliteal artery calcifications are noted.  IMPRESSION:  Minimally displaced intra-articular fracture involving the medial femoral condyle.   Original Report Authenticated By: P. Loralie Champagne, M.D.       Cheyenne Jakes, MD 01/13/12 [phone]

## 2012-01-13 NOTE — ED Notes (Signed)
Called to give report. Per Tammy on 300- nurse unavailable to take report at present moment. Nurse will return call for report.

## 2012-01-13 NOTE — Evaluation (Signed)
Clinical/Bedside Swallow Evaluation Patient Details  Name: JAZMINA PACHUCA MRN: [id number] Date of Birth: 1919-11-18  Today's Date: 01/13/2012 Time: 4098-1191 SLP Time Calculation (min): 18 min  Past Medical History:  Past Medical History  Diagnosis Date  . Hypertension    Past Surgical History:  Past Surgical History  Procedure Date  . Abdominal hysterectomy    HPI:  78 yo female admitted s/p fall and injury to knee.    Assessment / Plan / Recommendation Clinical Impression  Swallow function appears WNL, ok for regular diet    Aspiration Risk  Mild    Diet Recommendation Regular;Thin liquid   Liquid Administration via: Straw;Cup Medication Administration: Whole meds with liquid Supervision: Patient able to self feed Postural Changes and/or Swallow Maneuvers: Seated upright 90 degrees;Upright 30-60 min after meal    Other  Recommendations Oral Care Recommendations: Oral care BID   Follow Up Recommendations  None    Frequency and Duration            Swallow Study Prior Functional Status       General Date of Onset: 01/13/12 HPI: 78 yo female admitted s/p fall and injury to knee.  Type of Study: Bedside swallow evaluation Diet Prior to this Study: NPO Temperature Spikes Noted: No Respiratory Status: Room air History of Recent Intubation: No Behavior/Cognition: Alert;Cooperative;Pleasant mood Oral Cavity - Dentition: Edentulous Self-Feeding Abilities: Able to feed self Patient Positioning: Upright in bed Baseline Vocal Quality: Clear Volitional Cough: Strong Volitional Swallow: Able to elicit    Oral/Motor/Sensory Function Overall Oral Motor/Sensory Function: Appears within functional limits for tasks assessed   Ice Chips Ice chips: Within functional limits   Thin Liquid Thin Liquid: Within functional limits    Nectar Thick Nectar Thick Liquid: Not tested   Honey Thick Honey Thick Liquid: Not tested   Puree Puree: Within functional limits Presentation:  Spoon   Solid   GO    Solid: Within functional limits Presentation: Spoon;Self Fed       Rosana Farnell 01/13/2012,11:18 AM

## 2012-01-13 NOTE — ED Notes (Signed)
Patient states "my right leg gave out on me this morning and I fell, bending my leg back when I fell." Complaining of pain in right knee.

## 2012-01-13 NOTE — Progress Notes (Signed)
INITIAL ADULT NUTRITION ASSESSMENT Date: 01/13/2012   Time: 1:33 PM Reason for Assessment: MD consult (hip fx protocol)  INTERVENTION: Continue with current POC.   ASSESSMENT: Female 78 y.o.  Dx: <principal problem not specified>  Hx:  Past Surgical History  Procedure Date  . Abdominal hysterectomy    Past Surgical History  Procedure Date  . Abdominal hysterectomy    Related Meds:  Scheduled Meds:   . acetaminophen  650 mg Oral Once  . docusate sodium  100 mg Oral BID  . enoxaparin (LOVENOX) injection  40 mg Subcutaneous Once  . senna  1 tablet Oral BID   Continuous Infusions:  PRN Meds:.HYDROcodone-acetaminophen, morphine injection, senna-docusate, sodium phosphate  Ht: 5\' 1"  (154.9 cm)  Wt: 130 lb (58.968 kg)  Ideal Wt: 47.8 kg  % Ideal Wt: 124%  Usual Wt: 130# % Usual Wt: 100% Wt Readings from Last 10 Encounters:  01/13/12 130 lb (58.968 kg)  04/22/11 133 lb (60.328 kg)  10/24/08 130 lb (58.968 kg)    Body mass index is 24.56 kg/(m^2). Classified as normal weight.   Food/Nutrition Related Hx: Pt is a very pleasant lady who reports she fractured her knee earlier this AM. She is very independent, able to complete most of her housework herself. She lives with her grandson, who prepares all meals for pt. PTA pt ate a well balanced diet which included foods such as pinto beans, squash, chicken, cabbage, and potatoes.  She reports hx of weight loss "some time ago" due to decreased appetite, but has been able to maintain her current weight. No hx of recent wt loss. She has ill fitting dentures that she does not use. She denies chewing or swallowing difficulty.  She reports drinking Ensure at home occasionally as a meal replacement. She refused offer ordering Ensure with her meals. She reports she was able to eat 100% of her lunch today.   Labs:  CMP     Component Value Date/Time   NA 140 01/13/2012 0826   K 3.8 01/13/2012 0826   CL 100 01/13/2012 0826   CO2 29  01/13/2012 0826   GLUCOSE 111* 01/13/2012 0826   BUN 17 01/13/2012 0826   CREATININE 1.09 01/13/2012 0826   CALCIUM 10.0 01/13/2012 0826   PROT 6.2 05/28/2010 1023   ALBUMIN 3.7 01/13/2012 0839   AST 26 05/28/2010 1023   ALT 13 05/28/2010 1023   ALKPHOS 38* 05/28/2010 1023   BILITOT 0.7 05/28/2010 1023   GFRNONAA 43* 01/13/2012 0826   GFRAA 50* 01/13/2012 0826   Intake: No intake or output data in the 24 hours ending 01/13/12 1446  Diet Order: General  Supplements/Tube Feeding: none at this time  IVF:    Estimated Nutritional Needs:   Kcal:1120-1304 kcals daily Protein:47-59 grams protein daily Fluid:1.1-1.3 L fluid daily  NUTRITION DIAGNOSIS: No nutrition dx at this time  RELATED TO: n/a  AS EVIDENCE BY: n/a  MONITORING/EVALUATION(Goals): Weight, PO intake, labs, changes in status. 1) Pt will maintain current weight of 130# 2) Pt will meet >75% of estimated energy needs  EDUCATION NEEDS: -Education needs addressed  Dietitian #: [phone]  DOCUMENTATION CODES Per approved criteria  -Not Applicable    Melody Haver, RD, LDN 01/13/2012, 1:33 PM

## 2012-01-14 DIAGNOSIS — S7290XA Unspecified fracture of unspecified femur, initial encounter for closed fracture: Secondary | ICD-10-CM | POA: Diagnosis not present

## 2012-01-14 DIAGNOSIS — I10 Essential (primary) hypertension: Secondary | ICD-10-CM | POA: Diagnosis not present

## 2012-01-14 MED ORDER — ENOXAPARIN SODIUM 30 MG/0.3ML ~~LOC~~ SOLN
30.0000 mg | Freq: Once | SUBCUTANEOUS | Status: AC
  Administered 2012-01-14: 30 mg via SUBCUTANEOUS
  Filled 2012-01-14: qty 0.3

## 2012-01-14 NOTE — Progress Notes (Signed)
Physical Therapy Treatment Patient Details Name: [name] MRN: [id number] DOB: 1920/01/30 Today's Date: 01/14/2012 Time: 1338-1400 PT Time Calculation (min): 22 min  PT Assessment / Plan / Recommendation Comments on Treatment Session  Pt fatigues easily.  Will begin NWB ambulation on 01/15/12    Follow Up Recommendations  Home health PT    Barriers to Discharge  none      Equipment Recommendations    per eval   Recommendations for Other Services OT consult  Frequency 7X/week   Plan Discharge plan remains appropriate    Precautions / Restrictions Precautions Precautions: Fall Required Braces or Orthoses: Knee Immobilizer - Right Knee Immobilizer - Right: On at all times Restrictions Weight Bearing Restrictions: Yes RLE Weight Bearing: Non weight bearing       Mobility  Bed Mobility Bed Mobility: Sit to Supine Supine to Sit: 3: Mod assist Sitting - Scoot to Edge of Bed: 5: Supervision Sit to Supine: 4: Min assist Transfers Transfers: Sit to [employer] Transfers Sit to Stand: 3: Mod assist Stand to Sit: 5: Supervision Stand Pivot Transfers: 4: Min assist        PT Diagnosis:   fx distal femur PT Problem List:  difficulty walking; decreased mobility PT Treatment Interventions:  there activity/gt train   PT Goals    Visit Information  Last PT Received On: 01/14/12    Subjective Data  Subjective: Pt states minimal discomfort with transfers   Cognition    alert and oriented   Balance   decreased   End of Session PT - End of Session Equipment Utilized During Treatment: Gait belt Activity Tolerance: Patient tolerated treatment well Patient left: with call bell/phone within reach;in bed;with family/visitor present   GP     [place]ally Callas,CINDY 01/14/2012, 3:54 PM

## 2012-01-14 NOTE — Consult Note (Signed)
[name] MRN: [id number] DOB/AGE: 07/04/1919 78 y.o. Primary Care Physician:Torsha Lemus, MD Admit date: 01/13/2012 HPI: This is a 78 years old female well known patient to me admitted under Dr. Romeo Apple service due to Rt distal femur fracture. Medical consult is called to managed her medical illness. Patient's family has decided non surgical option to manage her fracture. She is lying in bed. He pain is controlled she has no complaint. No headache, fever, chills, cough, nausea,vomiting, abdominal pain, dysuria, urgency or frequency of urination.   Past Medical History  Diagnosis Date  . Hypertension    Past Surgical History  Procedure Date  . Abdominal hysterectomy         History reviewed. No pertinent family history.  Social History:  reports that she has never smoked. She does not have any smokeless tobacco history on file. She reports that she does not drink alcohol or use illicit drugs.   Allergies: No Known Allergies  Medications Prior to Admission  Medication Sig Dispense Refill  . amLODipine (NORVASC) 2.5 MG tablet Take 2.5 mg by mouth daily.      Marland Kitchen aspirin EC 81 MG tablet Take 81 mg by mouth daily.        . Calcium Carbonate-Vitamin D (CALCIUM 600 + D PO) Take 1 tablet by mouth daily.        . cloNIDine (CATAPRES) 0.1 MG tablet Take 0.1 mg by mouth 2 (two) times daily.      Marland Kitchen lisinopril (PRINIVIL,ZESTRIL) 40 MG tablet Take 40 mg by mouth daily.           WUJ:WJXBJ from the symptoms mentioned above,there are no other symptoms referable to all systems reviewed.  Physical Exam: Blood pressure 162/79, pulse 97, temperature 101.1 F (38.4 C), temperature source Oral, resp. rate 18, height 5\' 1"  (1.549 m), weight 58.968 kg (130 lb), SpO2 95.00%. General condition- alert awake and pleasant looking Respiratory- clear lung field CVS- S1 and S2  Heard, regular ABD- soft and lax, bowel sound is positive EXT- tenderness and swelling above rt knee    Basename  01/13/12 0826  WBC 7.2  NEUTROABS 5.0  HGB 13.6  HCT 41.4  MCV 89.8  PLT 177    Basename 01/13/12 0826  NA 140  K 3.8  CL 100  CO2 29  GLUCOSE 111*  BUN 17  CREATININE 1.09  CALCIUM 10.0  MG --  lablast2(ast:2,ALT:2,alkphos:2,bilitot:2,prot:2,albumin:2)@    No results found for this or any previous visit (from the past 240 hour(s)).   Dg Chest Port 1 View  01/13/2012  *RADIOLOGY REPORT*  Clinical Data: Larey Seat.  Right femur fracture.  PORTABLE CHEST - 1 VIEW  Comparison: 05/28/2010.  Findings: The cardiac silhouette, mediastinal and hilar contours are stable.  There is tortuosity and calcification of the thoracic aorta.  Prominent paratracheal shadows likely due to ectatic vasculature and/or thyroid goiter.  The lungs are clear.  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  The bony thorax is intact.  IMPRESSION:  1.  No acute cardiopulmonary findings. 2.  Stable prominent paratracheal soft tissue density, likely ectatic vasculature or thyroid goiter. 3.  Stable eventration of the left hemidiaphragm.   Original Report Authenticated By: P. Loralie Champagne, M.D.    Dg Knee Complete 4 Views Right  01/13/2012  *RADIOLOGY REPORT*  Clinical Data: Larey Seat.  Right knee pain.  RIGHT KNEE - COMPLETE 4+ VIEW  Comparison: None  Findings: There is an oblique coursing supracondylar femur fracture medially, extending down into the intercondylar  notch.  Minimal displacement.  The tibia and fibula are intact.  Chondrocalcinosis is noted.  Popliteal artery calcifications are noted.  IMPRESSION:  Minimally displaced intra-articular fracture involving the medial femoral condyle.   Original Report Authenticated By: P. Loralie Champagne, M.D.    Impression: RT distal femur fracture Hypertension H/O parathyroid Adenoma and S/p surgical resection Active Problems:  * No active hospital problems. *      Plan: Medications reviewed Continue current antihypertensives As per Dr Romeo Apple plan for  fracture.      [name]   01/14/2012, 7:40 PM

## 2012-01-14 NOTE — Progress Notes (Signed)
UR Chart Review Completed  

## 2012-01-14 NOTE — Evaluation (Signed)
Physical Therapy Evaluation Patient Details Name: [name] MRN: [id number] DOB: [birth year] Today's Date: 01/14/2012 Time: 0830- 845;  11:15-11:40-Pt became nauseated on first treatment.    PT Assessment / Plan / Recommendation Clinical Impression  Pt did very well with transfers.  Pt will need continute therapy to improve functional mobility and safety.    PT Assessment  Patient needs continued PT services    Follow Up Recommendations  Home health PT    Barriers to Discharge   none    Equipment Recommendations  Rolling walker with 5" wheels;3 in 1 bedside comode;Tub/shower bench    Recommendations for Other Services OT consult   Frequency 7X/week    Precautions / Restrictions Precautions Precautions: Fall;Other (comment) Required Braces or Orthoses: Knee Immobilizer - Right Knee Immobilizer - Right: On at all times Restrictions Weight Bearing Restrictions: Yes RLE Weight Bearing: Non weight bearing       Mobility  Bed Mobility Bed Mobility: Supine to Sit;Sitting - Scoot to Edge of Bed Supine to Sit: 4: Min assist Sitting - Scoot to [place] of Bed: 4: Min assist Transfers Transfers: Sit to Stand;Stand to [employer] Transfers Sit to Stand: 3: Mod assist Stand to Sit: 5: Supervision Stand Pivot Transfers: 4: Min guard Ambulation/Gait Ambulation/Gait Assistance: Not tested (comment) (Pt stated she was to worn out to do anything else.) Assistive device: Rolling walker Gait Pattern: Step-to pattern Gait velocity: slow     Exercises  none done in todays session.  No ROM to knee   PT Diagnosis: Difficulty walking  PT Problem List: Decreased strength;Decreased activity tolerance;Decreased balance PT Treatment Interventions: Gait training;Functional mobility training;Therapeutic exercise   PT Goals Acute Rehab PT Goals PT Goal Formulation: With patient Time For Goal Achievement: 01/17/12 Potential to Achieve Goals: Good Pt will go Supine/Side to Sit: with  supervision PT Goal: Supine/Side to Sit - Progress: Goal set today Pt will go Sit to Stand: with min assist PT Goal: Sit to Stand - Progress: Goal set today Pt will go Stand to Sit: with modified independence PT Goal: Stand to Sit - Progress: Goal set today Pt will Ambulate: 1 - 15 feet;with supervision PT Goal: Ambulate - Progress: Goal set today  Visit Information  Last PT Received On: 01/14/12 Assistance Needed:min-mod    Subjective Data  Subjective: Pt states she know there will be some pain with it but she is going to do the best she can. Patient Stated Goal: to walk again   Prior Functioning  Home Living Lives With: Son Available Help at Discharge: Family;Available 24 hours/day Type of Home: House Home Access: Stairs to enter [employer] of Steps: 1 Entrance Stairs-Rails: None Home Layout: Able to live on main level with bedroom/bathroom Bathroom Shower/Tub: Engineer, manufacturing systems: Standard Home Adaptive Equipment: Straight cane;Quad cane Prior Function Level of Independence: Independent Able to Take Stairs?: Reciprically Driving: No Vocation: Retired Musician: No difficulties Dominant Hand: Right    Cognition  Overall Cognitive Status: Appears within functional limits for tasks assessed/performed Arousal/Alertness: Awake/alert Orientation Level: Appears intact for tasks assessed Behavior During Session: [facility] for tasks performed    Extremity/Trunk Assessment Right Lower Extremity Assessment RLE ROM/Strength/Tone: Unable to fully assess (No ROM allowed for LE.) Left Lower Extremity Assessment LLE ROM/Strength/Tone: Within functional levels   Balance Balance Balance Assessed: No  End of Session PT - End of Session Equipment Utilized During Treatment: Gait belt Activity Tolerance: Patient tolerated treatment well;Patient limited by fatigue Patient left: in chair;with call bell/phone within  reach;with family/visitor present    GP     Reathel Turi,CINDY 01/14/2012, 11:50 AM

## 2012-01-14 NOTE — Progress Notes (Signed)
Patient ID: [name], female   DOB: [birth year], 78 y.o.   MRN: [id number] HOSPITAL DAY 2   RIGHT MEDIAL FEMORAL CONDYLE FRACTURE  SHE DECIDED TO GO WITH BRACING  HOME HEALTH   DOING WELL HAD MILD TEMP ELEVATION  LIMB NEUROVASCULAR INTACT   RT DISTAL FEMORAL FRACTURE   PT   DISCHARGE TOMORROW

## 2012-01-14 NOTE — Clinical Social Work Psychosocial (Signed)
Clinical Social Work Department BRIEF PSYCHOSOCIAL ASSESSMENT 01/14/2012  Patient:  [name], [name]     Account Number:  [id number]     Admit date:  01/13/2012  Clinical Social Worker:  Nancie Neas  Date/Time:  01/14/2012 10:50 AM  Referred by:  Physician  Date Referred:  01/14/2012 Referred for  SNF Placement   Other Referral:   Interview type:  Patient Other interview type:   multiple family members    PSYCHOSOCIAL DATA Living Status:  FAMILY Admitted from facility:   Level of care:   Primary support name:  Hazel Primary support relationship to patient:  CHILD, ADULT Degree of support available:   very supportive    CURRENT CONCERNS Current Concerns  Post-Acute Placement   Other Concerns:    SOCIAL WORK ASSESSMENT / PLAN CSW met with pt and multiple family members at bedside following MD referral for SNF placement. Pt fell at home, fracturing her femur. Pt alert and oriented and reports she is in no pain. She lives at home with a grandson and her daughter Jerrye Beavers is there frequently as well. Pt is very independent in care. Family tries to encourage her to use her cane but she refuses. They state they are very comfortable with taking pt home and she will be going to [employer]. CSW discussed possibility of rehab but pt and family were not interested in this option. They are agreeable to home health.   Assessment/plan status:  Referral to [facility] Other assessment/ plan:   Information/referral to community resources:   CM for home health/equipment needs    PATIENT'S/FAMILY'S RESPONSE TO PLAN OF CARE: Pt and family feel they will be able to manage okay at home with home health. CM notified of needs. CSW will sign off but can be reconsulted if needed. Probable d/c tomorrow per MD note.        Derenda Fennel, [place] [phone]

## 2012-01-14 NOTE — Care Management Note (Signed)
    Page 1 of 2   01/15/2012     10:43:40 AM   CARE MANAGEMENT NOTE 01/15/2012  Patient:  [name], [name]   Account Number:  [id number]  Date Initiated:  01/14/2012  Documentation initiated by:  Sharrie Rothman  Subjective/Objective Assessment:   Pt admitted from home with right femur fracture. Pt lives alone but will be going home with daughter, [name], at discharge. Pts daughter requested SNF but has decided to take pt home with her.     Action/Plan:   Pt and pts daughter has chosen [facility] for [facility] and DME. Referral given to Tula Nakayama of [facility]. Pts daughter will pick equipment up from [facility] at discharge.   Anticipated DC Date:  01/15/2012   Anticipated DC Plan:  HOME W HOME HEALTH SERVICES  In-house referral  Clinical Social Worker      DC Associate Professor  CM consult      [facility] Choice  HOME HEALTH  DURABLE MEDICAL EQUIPMENT   Choice offered to / List presented to:  C-4 Adult Children   DME arranged  3-N-1  WALKER - ROLLING  SHOWER STOOL      DME agency  [facility]     HH arranged  HH-2 PT  HH-3 OT      [facility] agency  [facility]   Status of service:  Completed, signed off Medicare Important Message given?  YES (If response is "NO", the following Medicare IM given date fields will be blank) Date Medicare IM given:  01/14/2012 Date Additional Medicare IM given:    Discharge Disposition:  HOME W HOME HEALTH SERVICES  Per UR Regulation:    If discussed at Long Length of Stay Meetings, dates discussed:    Comments:  01/15/12 1038 Arlyss Queen, RN BSN CM Pt discharged home today with [facility] PT and OT. Alroy Bailiff of [facility] is aware and will collect the information from the chart. Pts daughter is going to get DME from [facility] store. HH services will start within the next 48 hours. No other CM needs noted. 01/14/12 1344 Arlyss Queen, RN BSN CM

## 2012-01-15 DIAGNOSIS — S72413A Displaced unspecified condyle fracture of lower end of unspecified femur, initial encounter for closed fracture: Principal | ICD-10-CM | POA: Diagnosis present

## 2012-01-15 DIAGNOSIS — S7290XA Unspecified fracture of unspecified femur, initial encounter for closed fracture: Secondary | ICD-10-CM | POA: Diagnosis not present

## 2012-01-15 DIAGNOSIS — I10 Essential (primary) hypertension: Secondary | ICD-10-CM | POA: Diagnosis not present

## 2012-01-15 MED ORDER — PROMETHAZINE HCL 12.5 MG PO TABS: 12.5000 mg | ORAL_TABLET | Freq: Four times a day (QID) | ORAL | Status: DC | PRN

## 2012-01-15 MED ORDER — DSS 100 MG PO CAPS: 100.0000 mg | ORAL_CAPSULE | Freq: Two times a day (BID) | ORAL | Status: DC

## 2012-01-15 MED ORDER — HYDROCODONE-ACETAMINOPHEN 5-325 MG PO TABS: 1.0000 | ORAL_TABLET | Freq: Four times a day (QID) | ORAL | Status: DC | PRN

## 2012-01-15 MED ORDER — ENOXAPARIN SODIUM 30 MG/0.3ML ~~LOC~~ SOLN: 30.0000 mg | Freq: Two times a day (BID) | SUBCUTANEOUS | Status: DC

## 2012-01-15 MED ORDER — SODIUM CHLORIDE 0.9 % IJ SOLN
INTRAMUSCULAR | Status: AC
  Filled 2012-01-15: qty 3

## 2012-01-15 NOTE — Progress Notes (Signed)
Physical Therapy Treatment Patient Details Name: [name] MRN: [id number] DOB: Sep 25, 1919 Today's Date: 01/15/2012 Time: 0840-0900 PT Time Calculation (min): 20 min  PT Assessment / Plan / Recommendation Comments on Treatment Session  Pt. with decreased assistance needed for transfers and was able to maintain NWB status. Family present and wishes pt. not to try ambulating at this time.     Follow Up Recommendations  Home health PT       Equipment Recommendations  Rolling walker with 5" wheels;3 in 1 bedside comode;Tub/shower bench             Precautions / Restrictions Precautions Precautions: Fall Required Braces or Orthoses: Knee Immobilizer - Right Knee Immobilizer - Right: On at all times Restrictions Weight Bearing Restrictions: Yes RLE Weight Bearing: Non weight bearing (Right LE)        Mobility  Bed Mobility Bed Mobility: Supine to Sit Supine to Sit: 6: Modified independent (Device/Increase time) Sitting - Scoot to Edge of Bed: 6: Modified independent (Device/Increase time) Sit to Supine: Not Tested (comment) Transfers Transfers: Sit to Stand;Stand to Sit Sit to Stand: 4: Min assist Stand to Sit: 4: Min guard Stand Pivot Transfers: 4: Min assist Ambulation/Gait Ambulation/Gait Assistance: Not tested (comment) (Family wishes not to try at this point)       PT Goals    Visit Information  Last PT Received On: 01/15/12    Subjective Data  Subjective: Pt. states she is ready to get out the bed.  States she has no pain and is going home today.   Cognition  Overall Cognitive Status: Appears within functional limits for tasks assessed/performed Arousal/Alertness: Awake/alert Orientation Level: Appears intact for tasks assessed Behavior During Session: [facility] for tasks performed       End of Session PT - End of Session Equipment Utilized During Treatment: Gait belt Activity Tolerance: Patient tolerated treatment well Patient left: in chair;with chair  alarm set;with family/visitor present Nurse Communication: Mobility status     Lurena Nida, PTA/CLT 01/15/2012, 9:29 AM

## 2012-01-15 NOTE — Discharge Summary (Signed)
Physician Discharge Summary  Patient ID: [name] MRN: [id number] DOB/AGE: 02-Aug-1919 16 y.o.  Admit date: 01/13/2012 Discharge date: 01/15/2012  Admission Diagnoses: Right right distal distal femur fracture medial condyle  Discharge Diagnoses: Same Active Problems:  * No active hospital problems. *    Discharged Condition: good  Hospital Course: This patient was admitted with a minimally displaced medial distal femoral condyle fracture. She has some vomiting after eating. She had a speech evaluation which did not reveal any issues. She had fever the first night of surgery which responded to incentive and mobilization. She tolerated physical therapy well and was discharged home  Consults: Dr. Felecia Shelling, physical and occupational therapy, speech therapy   Significant Diagnostic Studies: Plain radiographs  Treatments: therapies: PT, OT and ST  Discharge Exam: Blood pressure 179/92, pulse 99, temperature 99.4 F (37.4 C), temperature source Oral, resp. rate 16, height 5\' 1"  (1.549 m), weight 130 lb (58.968 kg), SpO2 97.00%. General appearance: alert, cooperative and appears stated age Extremities: No peripheral edema, calf nontender. Distal femur tenderness over the medial condyle. Small joint effusion.  Disposition: 01-Home or Self Care  Discharge Orders    Future Appointments: Provider: Department: Dept Phone: Center:   01/27/2012 8:00 AM Sherrie George, MD [facility] [phone] None     Future Orders Please Complete By Expires   Diet - low sodium heart healthy      Call MD / Call 911      Comments:   If you experience chest pain or shortness of breath, CALL 911 and be transported to the hospital emergency room.  If you develope a fever above 101 F, pus (white drainage) or increased drainage or redness at the wound, or calf pain, call your surgeon's office.   Constipation Prevention      Comments:   Drink plenty of fluids.  Prune juice may be helpful.  You may use  a stool softener, such as Colace (over the counter) 100 mg twice a day.  Use MiraLax (over the counter) for constipation as needed.   Discharge instructions      Comments:   Touch down weight bearing on right   Keep brace on at all times and adjust straps as needed       Medication List     As of 01/15/2012  7:46 AM    TAKE these medications         amLODipine 2.5 MG tablet   Commonly known as: NORVASC   Take 2.5 mg by mouth daily.      aspirin EC 81 MG tablet   Take 81 mg by mouth daily.      CALCIUM 600 + D PO   Take 1 tablet by mouth daily.      cloNIDine 0.1 MG tablet   Commonly known as: CATAPRES   Take 0.1 mg by mouth 2 (two) times daily.      DSS 100 MG Caps   Take 100 mg by mouth 2 (two) times daily.      enoxaparin 30 MG/0.3ML injection   Commonly known as: LOVENOX   Inject 0.3 mLs (30 mg total) into the skin every 12 (twelve) hours.      HYDROcodone-acetaminophen 5-325 MG per tablet   Commonly known as: NORCO/VICODIN   Take 1-2 tablets by mouth every 6 (six) hours as needed.      lisinopril 40 MG tablet   Commonly known as: PRINIVIL,ZESTRIL   Take 40 mg by mouth daily.  promethazine 12.5 MG tablet   Commonly known as: PHENERGAN   Take 1 tablet (12.5 mg total) by mouth every 6 (six) hours as needed for nausea.        office instructions x-ray in 2 weeks right knee  Signed: Fuller Canada 01/15/2012, 7:46 AM

## 2012-01-15 NOTE — Progress Notes (Signed)
Subjective: Feels better. Her pain is controlled  Objective: Vital signs in last 24 hours: Temp:  [99.4 F (37.4 C)-101.5 F (38.6 C)] 99.4 F (37.4 C) (09/18 0444) Pulse Rate:  [97-113] 99  (09/18 0444) Resp:  [16-20] 16  (09/18 0444) BP: (162-179)/(79-95) 179/92 mmHg (09/18 0444) SpO2:  [95 %-97 %] 97 % (09/18 0444) Weight change:  Last BM Date: 01/12/12  Intake/Output from previous day: 09/17 0701 - 09/18 0700 In: 250 [P.O.:250] Out: 3 [Blood:3]  PHYSICAL EXAM Resp: clear to auscultation bilaterally Cardio: S1, S2 normal GI: soft, non-tender; bowel sounds normal; no masses,  no organomegaly Extremities: rt lower extremity splinted   Lab Results:    @labtest @ [facility] No results found for this basename: PHART,PCO2,PO2ART,TCO2,HCO3 in the last 72 hours CULTURES No results found for this or any previous visit (from the past 240 hour(s)). Studies/Results: Dg Chest Port 1 View  01/13/2012  *RADIOLOGY REPORT*  Clinical Data: Larey Seat.  Right femur fracture.  PORTABLE CHEST - 1 VIEW  Comparison: 05/28/2010.  Findings: The cardiac silhouette, mediastinal and hilar contours are stable.  There is tortuosity and calcification of the thoracic aorta.  Prominent paratracheal shadows likely due to ectatic vasculature and/or thyroid goiter.  The lungs are clear.  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  The bony thorax is intact.  IMPRESSION:  1.  No acute cardiopulmonary findings. 2.  Stable prominent paratracheal soft tissue density, likely ectatic vasculature or thyroid goiter. 3.  Stable eventration of the left hemidiaphragm.   Original Report Authenticated By: P. Loralie Champagne, M.D.     Medications: I have reviewed the patient's current medications.  Assesment: 1. Rt distal femur fracture 2. hypertension Active Problems:  * No active hospital problems. *     Plan: Continue current treatment As per Dr. Romeo Apple plan.    LOS: 2 days   Dixon Luczak 01/15/2012,  7:45 AM

## 2012-01-15 NOTE — Progress Notes (Signed)
Patient ID: [name], female   DOB: 1919/08/31, 78 y.o.   MRN: [id number] Rolling walker with 5" wheels;3 in 1 bedside comode;Tub/shower bench   Dear devices will be ordered, the patient is stable.

## 2012-01-16 DIAGNOSIS — I10 Essential (primary) hypertension: Secondary | ICD-10-CM | POA: Diagnosis not present

## 2012-01-16 DIAGNOSIS — S8290XD Unspecified fracture of unspecified lower leg, subsequent encounter for closed fracture with routine healing: Secondary | ICD-10-CM | POA: Diagnosis not present

## 2012-01-16 DIAGNOSIS — R262 Difficulty in walking, not elsewhere classified: Secondary | ICD-10-CM | POA: Diagnosis not present

## 2012-01-16 DIAGNOSIS — R61 Generalized hyperhidrosis: Secondary | ICD-10-CM | POA: Diagnosis not present

## 2012-01-16 LAB — VITAMIN D 1,25 DIHYDROXY
Vitamin D2 1, 25 (OH)2: <8 pg/mL
Vitamin D3 1, 25 (OH)2: 93 pg/mL

## 2012-01-17 DIAGNOSIS — S8290XD Unspecified fracture of unspecified lower leg, subsequent encounter for closed fracture with routine healing: Secondary | ICD-10-CM | POA: Diagnosis not present

## 2012-01-17 DIAGNOSIS — R262 Difficulty in walking, not elsewhere classified: Secondary | ICD-10-CM | POA: Diagnosis not present

## 2012-01-17 DIAGNOSIS — I10 Essential (primary) hypertension: Secondary | ICD-10-CM | POA: Diagnosis not present

## 2012-01-20 DIAGNOSIS — S8290XD Unspecified fracture of unspecified lower leg, subsequent encounter for closed fracture with routine healing: Secondary | ICD-10-CM | POA: Diagnosis not present

## 2012-01-20 DIAGNOSIS — R262 Difficulty in walking, not elsewhere classified: Secondary | ICD-10-CM | POA: Diagnosis not present

## 2012-01-20 DIAGNOSIS — I10 Essential (primary) hypertension: Secondary | ICD-10-CM | POA: Diagnosis not present

## 2012-01-22 DIAGNOSIS — I10 Essential (primary) hypertension: Secondary | ICD-10-CM | POA: Diagnosis not present

## 2012-01-22 DIAGNOSIS — S8290XD Unspecified fracture of unspecified lower leg, subsequent encounter for closed fracture with routine healing: Secondary | ICD-10-CM | POA: Diagnosis not present

## 2012-01-22 DIAGNOSIS — R262 Difficulty in walking, not elsewhere classified: Secondary | ICD-10-CM | POA: Diagnosis not present

## 2012-01-23 ENCOUNTER — Telehealth: Payer: Self-pay | Admitting: Orthopedic Surgery

## 2012-01-23 DIAGNOSIS — R262 Difficulty in walking, not elsewhere classified: Secondary | ICD-10-CM | POA: Diagnosis not present

## 2012-01-23 DIAGNOSIS — S8290XD Unspecified fracture of unspecified lower leg, subsequent encounter for closed fracture with routine healing: Secondary | ICD-10-CM | POA: Diagnosis not present

## 2012-01-23 DIAGNOSIS — I10 Essential (primary) hypertension: Secondary | ICD-10-CM | POA: Diagnosis not present

## 2012-01-23 NOTE — Telephone Encounter (Signed)
NO! ILL TELL THEM WHEN I WANT ROM

## 2012-01-23 NOTE — Telephone Encounter (Signed)
Could not reach Jacki Cones, called [facility], spoke with Victorino Dike and gave her Dr. Mort Sawyers answer

## 2012-01-23 NOTE — Telephone Encounter (Signed)
Claudie Leach [facility] asked if [facility] can do any ROM out of the brace. Laurie's # [phone]

## 2012-01-24 DIAGNOSIS — I10 Essential (primary) hypertension: Secondary | ICD-10-CM | POA: Diagnosis not present

## 2012-01-24 DIAGNOSIS — R262 Difficulty in walking, not elsewhere classified: Secondary | ICD-10-CM | POA: Diagnosis not present

## 2012-01-24 DIAGNOSIS — S8290XD Unspecified fracture of unspecified lower leg, subsequent encounter for closed fracture with routine healing: Secondary | ICD-10-CM | POA: Diagnosis not present

## 2012-01-27 ENCOUNTER — Encounter (INDEPENDENT_AMBULATORY_CARE_PROVIDER_SITE_OTHER): Payer: Medicare Other | Admitting: Ophthalmology

## 2012-01-27 DIAGNOSIS — R262 Difficulty in walking, not elsewhere classified: Secondary | ICD-10-CM | POA: Diagnosis not present

## 2012-01-27 DIAGNOSIS — S8290XD Unspecified fracture of unspecified lower leg, subsequent encounter for closed fracture with routine healing: Secondary | ICD-10-CM | POA: Diagnosis not present

## 2012-01-27 DIAGNOSIS — I10 Essential (primary) hypertension: Secondary | ICD-10-CM | POA: Diagnosis not present

## 2012-01-28 DIAGNOSIS — S8290XD Unspecified fracture of unspecified lower leg, subsequent encounter for closed fracture with routine healing: Secondary | ICD-10-CM | POA: Diagnosis not present

## 2012-01-28 DIAGNOSIS — I10 Essential (primary) hypertension: Secondary | ICD-10-CM | POA: Diagnosis not present

## 2012-01-28 DIAGNOSIS — R262 Difficulty in walking, not elsewhere classified: Secondary | ICD-10-CM | POA: Diagnosis not present

## 2012-01-29 ENCOUNTER — Encounter: Payer: Self-pay | Admitting: Orthopedic Surgery

## 2012-01-29 ENCOUNTER — Ambulatory Visit (INDEPENDENT_AMBULATORY_CARE_PROVIDER_SITE_OTHER): Payer: Medicare Other | Admitting: Orthopedic Surgery

## 2012-01-29 ENCOUNTER — Ambulatory Visit (INDEPENDENT_AMBULATORY_CARE_PROVIDER_SITE_OTHER): Payer: Medicare Other

## 2012-01-29 VITALS — BP 102/60 | Ht 61.0 in | Wt 130.0 lb

## 2012-01-29 DIAGNOSIS — S72409A Unspecified fracture of lower end of unspecified femur, initial encounter for closed fracture: Secondary | ICD-10-CM

## 2012-01-29 DIAGNOSIS — I10 Essential (primary) hypertension: Secondary | ICD-10-CM | POA: Diagnosis not present

## 2012-01-29 DIAGNOSIS — S72413A Displaced unspecified condyle fracture of lower end of unspecified femur, initial encounter for closed fracture: Secondary | ICD-10-CM

## 2012-01-29 DIAGNOSIS — S8290XD Unspecified fracture of unspecified lower leg, subsequent encounter for closed fracture with routine healing: Secondary | ICD-10-CM | POA: Diagnosis not present

## 2012-01-29 DIAGNOSIS — R262 Difficulty in walking, not elsewhere classified: Secondary | ICD-10-CM | POA: Diagnosis not present

## 2012-01-29 NOTE — Patient Instructions (Signed)
Brace - continue  No weight bearing

## 2012-01-29 NOTE — Progress Notes (Signed)
Patient ID: [name], female   DOB: 26-Jun-1919, 78 y.o.   MRN: [id number] Chief Complaint  Patient presents with  . Follow-up    2 week hospital follow up, femur fracture DOI 01/13/12   Medial condyle fracture, RIGHT distal femur. Patient opted for nonoperative treatment. She is in a long-leg immobilizer. No weightbearing.  Comes in for 2 week postinjury x-ray  X-ray shows minimal fracture displacement on the AP view and slight posterior angulation on the lateral view.  Recommend continued immobilization. X-ray in 2 weeks and apply T. Scope brace.

## 2012-01-30 DIAGNOSIS — R262 Difficulty in walking, not elsewhere classified: Secondary | ICD-10-CM | POA: Diagnosis not present

## 2012-01-30 DIAGNOSIS — I10 Essential (primary) hypertension: Secondary | ICD-10-CM | POA: Diagnosis not present

## 2012-01-30 DIAGNOSIS — S8290XD Unspecified fracture of unspecified lower leg, subsequent encounter for closed fracture with routine healing: Secondary | ICD-10-CM | POA: Diagnosis not present

## 2012-01-31 DIAGNOSIS — R262 Difficulty in walking, not elsewhere classified: Secondary | ICD-10-CM | POA: Diagnosis not present

## 2012-01-31 DIAGNOSIS — I10 Essential (primary) hypertension: Secondary | ICD-10-CM | POA: Diagnosis not present

## 2012-01-31 DIAGNOSIS — S8290XD Unspecified fracture of unspecified lower leg, subsequent encounter for closed fracture with routine healing: Secondary | ICD-10-CM | POA: Diagnosis not present

## 2012-02-03 DIAGNOSIS — R262 Difficulty in walking, not elsewhere classified: Secondary | ICD-10-CM | POA: Diagnosis not present

## 2012-02-03 DIAGNOSIS — S8290XD Unspecified fracture of unspecified lower leg, subsequent encounter for closed fracture with routine healing: Secondary | ICD-10-CM | POA: Diagnosis not present

## 2012-02-03 DIAGNOSIS — I10 Essential (primary) hypertension: Secondary | ICD-10-CM | POA: Diagnosis not present

## 2012-02-04 DIAGNOSIS — I10 Essential (primary) hypertension: Secondary | ICD-10-CM | POA: Diagnosis not present

## 2012-02-04 DIAGNOSIS — S8290XD Unspecified fracture of unspecified lower leg, subsequent encounter for closed fracture with routine healing: Secondary | ICD-10-CM | POA: Diagnosis not present

## 2012-02-04 DIAGNOSIS — R262 Difficulty in walking, not elsewhere classified: Secondary | ICD-10-CM | POA: Diagnosis not present

## 2012-02-05 DIAGNOSIS — S8290XD Unspecified fracture of unspecified lower leg, subsequent encounter for closed fracture with routine healing: Secondary | ICD-10-CM | POA: Diagnosis not present

## 2012-02-05 DIAGNOSIS — R262 Difficulty in walking, not elsewhere classified: Secondary | ICD-10-CM | POA: Diagnosis not present

## 2012-02-05 DIAGNOSIS — I10 Essential (primary) hypertension: Secondary | ICD-10-CM | POA: Diagnosis not present

## 2012-02-07 DIAGNOSIS — R262 Difficulty in walking, not elsewhere classified: Secondary | ICD-10-CM | POA: Diagnosis not present

## 2012-02-07 DIAGNOSIS — S8290XD Unspecified fracture of unspecified lower leg, subsequent encounter for closed fracture with routine healing: Secondary | ICD-10-CM | POA: Diagnosis not present

## 2012-02-07 DIAGNOSIS — I10 Essential (primary) hypertension: Secondary | ICD-10-CM | POA: Diagnosis not present

## 2012-02-11 DIAGNOSIS — I10 Essential (primary) hypertension: Secondary | ICD-10-CM | POA: Diagnosis not present

## 2012-02-11 DIAGNOSIS — S8290XD Unspecified fracture of unspecified lower leg, subsequent encounter for closed fracture with routine healing: Secondary | ICD-10-CM | POA: Diagnosis not present

## 2012-02-11 DIAGNOSIS — R262 Difficulty in walking, not elsewhere classified: Secondary | ICD-10-CM | POA: Diagnosis not present

## 2012-02-12 ENCOUNTER — Encounter: Payer: Self-pay | Admitting: Orthopedic Surgery

## 2012-02-12 ENCOUNTER — Ambulatory Visit (INDEPENDENT_AMBULATORY_CARE_PROVIDER_SITE_OTHER): Payer: Medicare Other

## 2012-02-12 ENCOUNTER — Ambulatory Visit (INDEPENDENT_AMBULATORY_CARE_PROVIDER_SITE_OTHER): Payer: Medicare Other | Admitting: Orthopedic Surgery

## 2012-02-12 VITALS — BP 102/60 | Ht 61.0 in | Wt 130.0 lb

## 2012-02-12 DIAGNOSIS — S7290XA Unspecified fracture of unspecified femur, initial encounter for closed fracture: Secondary | ICD-10-CM

## 2012-02-12 DIAGNOSIS — S72413A Displaced unspecified condyle fracture of lower end of unspecified femur, initial encounter for closed fracture: Secondary | ICD-10-CM

## 2012-02-12 NOTE — Progress Notes (Signed)
Patient ID: [name], female   DOB: [birth year], 78 y.o.   MRN: [id number] Chief Complaint  Patient presents with  . Follow-up    right distal femur fracture, DOI 01/13/12     This is a 30 day followup of the medial condyle fracture distal femur that the family and patient opted to treat nonoperatively. She's been in a straight leg brace for the first 4 weeks  X-rays show callus formation and no major displacement of the fracture.  Recommend hinged brace 0-70 continual minimal weightbearing  Followup 4 weeks x-ray.

## 2012-02-13 DIAGNOSIS — R262 Difficulty in walking, not elsewhere classified: Secondary | ICD-10-CM | POA: Diagnosis not present

## 2012-02-13 DIAGNOSIS — I10 Essential (primary) hypertension: Secondary | ICD-10-CM | POA: Diagnosis not present

## 2012-02-13 DIAGNOSIS — S8290XD Unspecified fracture of unspecified lower leg, subsequent encounter for closed fracture with routine healing: Secondary | ICD-10-CM | POA: Diagnosis not present

## 2012-02-17 DIAGNOSIS — R262 Difficulty in walking, not elsewhere classified: Secondary | ICD-10-CM | POA: Diagnosis not present

## 2012-02-17 DIAGNOSIS — I10 Essential (primary) hypertension: Secondary | ICD-10-CM | POA: Diagnosis not present

## 2012-02-17 DIAGNOSIS — S8290XD Unspecified fracture of unspecified lower leg, subsequent encounter for closed fracture with routine healing: Secondary | ICD-10-CM | POA: Diagnosis not present

## 2012-02-18 ENCOUNTER — Telehealth: Payer: Self-pay | Admitting: Orthopedic Surgery

## 2012-02-18 ENCOUNTER — Other Ambulatory Visit: Payer: Self-pay | Admitting: Orthopedic Surgery

## 2012-02-18 DIAGNOSIS — S8290XD Unspecified fracture of unspecified lower leg, subsequent encounter for closed fracture with routine healing: Secondary | ICD-10-CM | POA: Diagnosis not present

## 2012-02-18 DIAGNOSIS — R262 Difficulty in walking, not elsewhere classified: Secondary | ICD-10-CM | POA: Diagnosis not present

## 2012-02-18 DIAGNOSIS — S72413A Displaced unspecified condyle fracture of lower end of unspecified femur, initial encounter for closed fracture: Secondary | ICD-10-CM

## 2012-02-18 DIAGNOSIS — I10 Essential (primary) hypertension: Secondary | ICD-10-CM | POA: Diagnosis not present

## 2012-02-18 NOTE — Telephone Encounter (Signed)
[name]/[facility] wants to continue OT for [facility] 2 x a week for 3 more weeks.  Please advise  [name]'s phone # [phone]

## 2012-02-18 NOTE — Telephone Encounter (Signed)
Bruce thinks she needs OT because she cannot bathe or dress herself due to weakness in her arms and he wants her to gain some strength so she can take care of herself.

## 2012-02-18 NOTE — Telephone Encounter (Signed)
Is this OT or PT ?? shoulde be PT   Does not need OT for Knee injury

## 2012-02-19 ENCOUNTER — Other Ambulatory Visit: Payer: Self-pay | Admitting: Orthopedic Surgery

## 2012-02-19 DIAGNOSIS — S72413A Displaced unspecified condyle fracture of lower end of unspecified femur, initial encounter for closed fracture: Secondary | ICD-10-CM

## 2012-02-19 NOTE — Telephone Encounter (Signed)
Bruce aware, he did not want it faxed, just wanted a verbal

## 2012-02-19 NOTE — Telephone Encounter (Signed)
Order placed in epic  Fax to bruce

## 2012-02-19 NOTE — Telephone Encounter (Signed)
Called Bruce, left message

## 2012-02-21 DIAGNOSIS — R262 Difficulty in walking, not elsewhere classified: Secondary | ICD-10-CM | POA: Diagnosis not present

## 2012-02-21 DIAGNOSIS — S8290XD Unspecified fracture of unspecified lower leg, subsequent encounter for closed fracture with routine healing: Secondary | ICD-10-CM | POA: Diagnosis not present

## 2012-02-21 DIAGNOSIS — I10 Essential (primary) hypertension: Secondary | ICD-10-CM | POA: Diagnosis not present

## 2012-02-24 DIAGNOSIS — S8290XD Unspecified fracture of unspecified lower leg, subsequent encounter for closed fracture with routine healing: Secondary | ICD-10-CM | POA: Diagnosis not present

## 2012-02-24 DIAGNOSIS — R262 Difficulty in walking, not elsewhere classified: Secondary | ICD-10-CM | POA: Diagnosis not present

## 2012-02-24 DIAGNOSIS — I10 Essential (primary) hypertension: Secondary | ICD-10-CM | POA: Diagnosis not present

## 2012-02-27 DIAGNOSIS — S8290XD Unspecified fracture of unspecified lower leg, subsequent encounter for closed fracture with routine healing: Secondary | ICD-10-CM | POA: Diagnosis not present

## 2012-02-27 DIAGNOSIS — R262 Difficulty in walking, not elsewhere classified: Secondary | ICD-10-CM | POA: Diagnosis not present

## 2012-02-27 DIAGNOSIS — I10 Essential (primary) hypertension: Secondary | ICD-10-CM | POA: Diagnosis not present

## 2012-03-02 DIAGNOSIS — I10 Essential (primary) hypertension: Secondary | ICD-10-CM | POA: Diagnosis not present

## 2012-03-02 DIAGNOSIS — S8290XD Unspecified fracture of unspecified lower leg, subsequent encounter for closed fracture with routine healing: Secondary | ICD-10-CM | POA: Diagnosis not present

## 2012-03-02 DIAGNOSIS — R262 Difficulty in walking, not elsewhere classified: Secondary | ICD-10-CM | POA: Diagnosis not present

## 2012-03-05 DIAGNOSIS — R262 Difficulty in walking, not elsewhere classified: Secondary | ICD-10-CM | POA: Diagnosis not present

## 2012-03-05 DIAGNOSIS — S8290XD Unspecified fracture of unspecified lower leg, subsequent encounter for closed fracture with routine healing: Secondary | ICD-10-CM | POA: Diagnosis not present

## 2012-03-05 DIAGNOSIS — I10 Essential (primary) hypertension: Secondary | ICD-10-CM | POA: Diagnosis not present

## 2012-03-11 ENCOUNTER — Ambulatory Visit (INDEPENDENT_AMBULATORY_CARE_PROVIDER_SITE_OTHER): Payer: Medicare Other

## 2012-03-11 ENCOUNTER — Encounter: Payer: Self-pay | Admitting: Orthopedic Surgery

## 2012-03-11 ENCOUNTER — Ambulatory Visit (INDEPENDENT_AMBULATORY_CARE_PROVIDER_SITE_OTHER): Payer: Medicare Other | Admitting: Orthopedic Surgery

## 2012-03-11 VITALS — Ht 61.0 in | Wt 130.0 lb

## 2012-03-11 DIAGNOSIS — S8290XD Unspecified fracture of unspecified lower leg, subsequent encounter for closed fracture with routine healing: Secondary | ICD-10-CM | POA: Diagnosis not present

## 2012-03-11 DIAGNOSIS — S82899A Other fracture of unspecified lower leg, initial encounter for closed fracture: Secondary | ICD-10-CM

## 2012-03-11 DIAGNOSIS — S72413A Displaced unspecified condyle fracture of lower end of unspecified femur, initial encounter for closed fracture: Secondary | ICD-10-CM

## 2012-03-11 DIAGNOSIS — IMO0002 Reserved for concepts with insufficient information to code with codable children: Secondary | ICD-10-CM

## 2012-03-11 DIAGNOSIS — I10 Essential (primary) hypertension: Secondary | ICD-10-CM | POA: Diagnosis not present

## 2012-03-11 DIAGNOSIS — R262 Difficulty in walking, not elsewhere classified: Secondary | ICD-10-CM | POA: Diagnosis not present

## 2012-03-11 NOTE — Patient Instructions (Addendum)
Brace for walking

## 2012-03-11 NOTE — Progress Notes (Signed)
Patient ID: [name], female   DOB: 10-06-1919, 78 y.o.   MRN: [id number] Chief Complaint  Patient presents with  . Follow-up    4 week recheck on right distal femur fracture.    DOI: 01-13-2012 Family option was to treat in brace    xrays show adequate fracture healing   In brace ROM is 0-90   Left lower extremity is moving fine with adequate strength

## 2012-03-12 DIAGNOSIS — R262 Difficulty in walking, not elsewhere classified: Secondary | ICD-10-CM | POA: Diagnosis not present

## 2012-03-12 DIAGNOSIS — S8290XD Unspecified fracture of unspecified lower leg, subsequent encounter for closed fracture with routine healing: Secondary | ICD-10-CM | POA: Diagnosis not present

## 2012-03-12 DIAGNOSIS — I10 Essential (primary) hypertension: Secondary | ICD-10-CM | POA: Diagnosis not present

## 2012-03-16 DIAGNOSIS — R262 Difficulty in walking, not elsewhere classified: Secondary | ICD-10-CM | POA: Diagnosis not present

## 2012-03-16 DIAGNOSIS — I10 Essential (primary) hypertension: Secondary | ICD-10-CM | POA: Diagnosis not present

## 2012-03-16 DIAGNOSIS — S8290XD Unspecified fracture of unspecified lower leg, subsequent encounter for closed fracture with routine healing: Secondary | ICD-10-CM | POA: Diagnosis not present

## 2012-03-18 DIAGNOSIS — R262 Difficulty in walking, not elsewhere classified: Secondary | ICD-10-CM | POA: Diagnosis not present

## 2012-03-18 DIAGNOSIS — S8290XD Unspecified fracture of unspecified lower leg, subsequent encounter for closed fracture with routine healing: Secondary | ICD-10-CM | POA: Diagnosis not present

## 2012-03-18 DIAGNOSIS — I10 Essential (primary) hypertension: Secondary | ICD-10-CM | POA: Diagnosis not present

## 2012-03-20 DIAGNOSIS — R262 Difficulty in walking, not elsewhere classified: Secondary | ICD-10-CM | POA: Diagnosis not present

## 2012-03-20 DIAGNOSIS — S8290XD Unspecified fracture of unspecified lower leg, subsequent encounter for closed fracture with routine healing: Secondary | ICD-10-CM | POA: Diagnosis not present

## 2012-03-20 DIAGNOSIS — I10 Essential (primary) hypertension: Secondary | ICD-10-CM | POA: Diagnosis not present

## 2012-03-23 DIAGNOSIS — I10 Essential (primary) hypertension: Secondary | ICD-10-CM | POA: Diagnosis not present

## 2012-03-23 DIAGNOSIS — R262 Difficulty in walking, not elsewhere classified: Secondary | ICD-10-CM | POA: Diagnosis not present

## 2012-03-23 DIAGNOSIS — S8290XD Unspecified fracture of unspecified lower leg, subsequent encounter for closed fracture with routine healing: Secondary | ICD-10-CM | POA: Diagnosis not present

## 2012-03-27 DIAGNOSIS — I10 Essential (primary) hypertension: Secondary | ICD-10-CM | POA: Diagnosis not present

## 2012-03-27 DIAGNOSIS — R262 Difficulty in walking, not elsewhere classified: Secondary | ICD-10-CM | POA: Diagnosis not present

## 2012-03-27 DIAGNOSIS — S8290XD Unspecified fracture of unspecified lower leg, subsequent encounter for closed fracture with routine healing: Secondary | ICD-10-CM | POA: Diagnosis not present

## 2012-03-30 DIAGNOSIS — I10 Essential (primary) hypertension: Secondary | ICD-10-CM | POA: Diagnosis not present

## 2012-03-30 DIAGNOSIS — R262 Difficulty in walking, not elsewhere classified: Secondary | ICD-10-CM | POA: Diagnosis not present

## 2012-03-30 DIAGNOSIS — S8290XD Unspecified fracture of unspecified lower leg, subsequent encounter for closed fracture with routine healing: Secondary | ICD-10-CM | POA: Diagnosis not present

## 2012-04-02 DIAGNOSIS — I10 Essential (primary) hypertension: Secondary | ICD-10-CM | POA: Diagnosis not present

## 2012-04-02 DIAGNOSIS — S8290XD Unspecified fracture of unspecified lower leg, subsequent encounter for closed fracture with routine healing: Secondary | ICD-10-CM | POA: Diagnosis not present

## 2012-04-02 DIAGNOSIS — R262 Difficulty in walking, not elsewhere classified: Secondary | ICD-10-CM | POA: Diagnosis not present

## 2012-04-07 DIAGNOSIS — S8290XD Unspecified fracture of unspecified lower leg, subsequent encounter for closed fracture with routine healing: Secondary | ICD-10-CM | POA: Diagnosis not present

## 2012-04-07 DIAGNOSIS — R262 Difficulty in walking, not elsewhere classified: Secondary | ICD-10-CM | POA: Diagnosis not present

## 2012-04-07 DIAGNOSIS — I10 Essential (primary) hypertension: Secondary | ICD-10-CM | POA: Diagnosis not present

## 2012-04-08 ENCOUNTER — Ambulatory Visit (INDEPENDENT_AMBULATORY_CARE_PROVIDER_SITE_OTHER): Payer: Medicare Other | Admitting: Orthopedic Surgery

## 2012-04-08 ENCOUNTER — Ambulatory Visit (INDEPENDENT_AMBULATORY_CARE_PROVIDER_SITE_OTHER): Payer: Medicare Other

## 2012-04-08 ENCOUNTER — Encounter: Payer: Self-pay | Admitting: Orthopedic Surgery

## 2012-04-08 VITALS — BP 142/80 | Ht 61.0 in | Wt 130.0 lb

## 2012-04-08 DIAGNOSIS — S82899A Other fracture of unspecified lower leg, initial encounter for closed fracture: Secondary | ICD-10-CM

## 2012-04-08 DIAGNOSIS — IMO0002 Reserved for concepts with insufficient information to code with codable children: Secondary | ICD-10-CM

## 2012-04-08 DIAGNOSIS — S72413A Displaced unspecified condyle fracture of lower end of unspecified femur, initial encounter for closed fracture: Secondary | ICD-10-CM

## 2012-04-08 NOTE — Progress Notes (Signed)
Patient ID: [name], female   DOB: [birth year], 78 y.o.   MRN: [id number] Chief Complaint  Patient presents with  . Follow-up    4 week recheck on right knee fracture with xray. (01-13-2012)   1. Knee fracture  DG Knee AP/LAT W/Sunrise Right  2. Closed fracture of femoral condyle      Fracture care   xrays show healing  Varus OA   econo hinge brace applied   F/u prn

## 2012-04-08 NOTE — Patient Instructions (Signed)
activities as tolerated 

## 2012-04-08 NOTE — Progress Notes (Signed)
Patient ID: [name], female   DOB: [birth year], 78 y.o.   MRN: [id number] Chief Complaint  Patient presents with  . Follow-up    4 week recheck on right knee fracture with xray. (01-13-2012)    The patient is a medial femoral condylar fracture she's here for 12 weeks x-ray  X-ray shows good resolution of the fracture and healing with mild varus knee joint alignment on related to the fracture some degenerative changes seen as well.  Recommend economy hinged brace mainly for her arthritis  She can remove the long leg hinged brace which was for the condyle fracture  Followup as needed

## 2012-04-09 MED ORDER — HYDROCODONE-ACETAMINOPHEN 5-325 MG PO TABS: 1.0000 | ORAL_TABLET | Freq: Four times a day (QID) | ORAL | Status: DC | PRN

## 2012-04-09 NOTE — Addendum Note (Signed)
Addended by: Fuller Canada E on: 04/09/2012 11:03 AM   Modules accepted: Orders

## 2012-04-13 DIAGNOSIS — I10 Essential (primary) hypertension: Secondary | ICD-10-CM | POA: Diagnosis not present

## 2012-04-13 DIAGNOSIS — R262 Difficulty in walking, not elsewhere classified: Secondary | ICD-10-CM | POA: Diagnosis not present

## 2012-04-13 DIAGNOSIS — S8290XD Unspecified fracture of unspecified lower leg, subsequent encounter for closed fracture with routine healing: Secondary | ICD-10-CM | POA: Diagnosis not present

## 2012-04-20 ENCOUNTER — Telehealth: Payer: Self-pay | Admitting: Orthopedic Surgery

## 2012-04-20 NOTE — Telephone Encounter (Signed)
Patient scheduled, aware

## 2012-04-20 NOTE — Telephone Encounter (Signed)
Ok  schedule for next week

## 2012-04-20 NOTE — Telephone Encounter (Signed)
Altamese Cabal, Magdalynn Scale's daughter said that Layann is complaining of pain in her right knee down to her toes.  Anquanette says "I am not healed" and is asking Jerrye Beavers to take her to the ER.  Hazel asked if you will see  her again to confirm to Joniqua what you told her at the last office visit.  Jerrye Beavers said that if she asked Malachi if she is in pain, one time she will say yes and the next time will say no.  She is giving her the pain medicine you prescribed and does not know what to do.  Hazel's # [phone]

## 2012-04-27 ENCOUNTER — Ambulatory Visit (INDEPENDENT_AMBULATORY_CARE_PROVIDER_SITE_OTHER): Payer: Medicare Other | Admitting: Orthopedic Surgery

## 2012-04-27 ENCOUNTER — Encounter: Payer: Self-pay | Admitting: Orthopedic Surgery

## 2012-04-27 VITALS — BP 150/90 | Ht 61.0 in | Wt 130.0 lb

## 2012-04-27 DIAGNOSIS — M1710 Unilateral primary osteoarthritis, unspecified knee: Secondary | ICD-10-CM

## 2012-04-27 DIAGNOSIS — S72413A Displaced unspecified condyle fracture of lower end of unspecified femur, initial encounter for closed fracture: Secondary | ICD-10-CM | POA: Diagnosis not present

## 2012-04-27 NOTE — Patient Instructions (Addendum)
APPLY MAX FREEZE AS NEEDED FOR KNEE PAIN    You have received a steroid shot. 15% of patients experience increased pain at the injection site with in the next 24 hours. This is best treated with ice and tylenol extra strength 2 tabs every 8 hours. If you are still having pain please call the office.

## 2012-04-27 NOTE — Progress Notes (Signed)
  Subjective:    Patient ID: [name], female    DOB: 01-31-1920, 78 y.o.   MRN: [id number]  HPI Comments: 19 Plus-year-old female with RIGHT knee pain status post medial condyle fracture, treated with bracing, per family request. She had her brace removed. Her last visit, and she was placed in a smaller hinged knee brace and was continued to complain of pain in her knee and not healed. Her last x-ray shows fracture healing with varus alignment of the overall joint.  She presents now for further evaluation     Review of Systemsnegative      Objective:   Physical Exam  BP 150/90  Ht 5\' 1"  (1.549 m)  Wt 130 lb (58.968 kg)  BMI 24.56 kg/m2 Physical Exam(6) GENERAL: normal development   CDV: pulses are normal   Skin: normal  Psychiatric: awake, alert and oriented  Neuro: normal sensation Ambulation as supported by walker. She has medial join line tenderness with a varus knee. Her flexion is 95 she has a slight flexion contracture. Ligaments are stable. Muscle tone is normal.     Assessment & Plan:  Osteoarthritis with varus alignment, status post medial condyle fracture.  Recommend injection, continue bracing, continue oral pain medication and is back surgeries as needed.  P.r.n. followup  Knee  Injection Procedure Note  Pre-operative Diagnosis: right knee oa  Post-operative Diagnosis: same  Indications: pain  Anesthesia: ethyl chloride   Procedure Details   Verbal consent was obtained for the procedure. Time out was completed.The joint was prepped with alcohol, followed by  Ethyl chloride spray and A 20 gauge needle was inserted into the knee via lateral approach; 4ml 1% lidocaine and 1 ml of depomedrol  was then injected into the joint . The needle was removed and the area cleansed and dressed.  Complications:  None; patient tolerated the procedure well.

## 2012-06-02 DIAGNOSIS — S72143A Displaced intertrochanteric fracture of unspecified femur, initial encounter for closed fracture: Secondary | ICD-10-CM | POA: Diagnosis not present

## 2012-06-02 DIAGNOSIS — I10 Essential (primary) hypertension: Secondary | ICD-10-CM | POA: Diagnosis not present

## 2012-06-02 DIAGNOSIS — I739 Peripheral vascular disease, unspecified: Secondary | ICD-10-CM | POA: Diagnosis not present

## 2012-09-01 DIAGNOSIS — I10 Essential (primary) hypertension: Secondary | ICD-10-CM | POA: Diagnosis not present

## 2012-09-01 DIAGNOSIS — F0280 Dementia in other diseases classified elsewhere without behavioral disturbance: Secondary | ICD-10-CM | POA: Diagnosis not present

## 2012-12-08 DIAGNOSIS — I10 Essential (primary) hypertension: Secondary | ICD-10-CM | POA: Diagnosis not present

## 2012-12-08 DIAGNOSIS — R634 Abnormal weight loss: Secondary | ICD-10-CM | POA: Diagnosis not present

## 2012-12-08 DIAGNOSIS — M1990 Unspecified osteoarthritis, unspecified site: Secondary | ICD-10-CM | POA: Diagnosis not present

## 2012-12-08 DIAGNOSIS — R5383 Other fatigue: Secondary | ICD-10-CM | POA: Diagnosis not present

## 2013-03-09 DIAGNOSIS — I10 Essential (primary) hypertension: Secondary | ICD-10-CM | POA: Diagnosis not present

## 2013-03-09 DIAGNOSIS — M1990 Unspecified osteoarthritis, unspecified site: Secondary | ICD-10-CM | POA: Diagnosis not present

## 2013-03-09 DIAGNOSIS — Z23 Encounter for immunization: Secondary | ICD-10-CM | POA: Diagnosis not present

## 2013-06-01 DIAGNOSIS — F0280 Dementia in other diseases classified elsewhere without behavioral disturbance: Secondary | ICD-10-CM | POA: Diagnosis not present

## 2013-06-01 DIAGNOSIS — I10 Essential (primary) hypertension: Secondary | ICD-10-CM | POA: Diagnosis not present

## 2013-08-31 DIAGNOSIS — F0280 Dementia in other diseases classified elsewhere without behavioral disturbance: Secondary | ICD-10-CM | POA: Diagnosis not present

## 2013-08-31 DIAGNOSIS — N39498 Other specified urinary incontinence: Secondary | ICD-10-CM | POA: Diagnosis not present

## 2013-08-31 DIAGNOSIS — I10 Essential (primary) hypertension: Secondary | ICD-10-CM | POA: Diagnosis not present

## 2013-11-08 IMAGING — CR DG ABDOMEN ACUTE W/ 1V CHEST
3 series · 3 of 3 positions shown · non-contrast
Comparison: None.

CLINICAL DATA: Abdominal pain and vomiting; weakness.

ACUTE ABDOMEN SERIES (ABDOMEN 2 VIEW & CHEST 1 VIEW)

[view not recorded (1 of 3)]
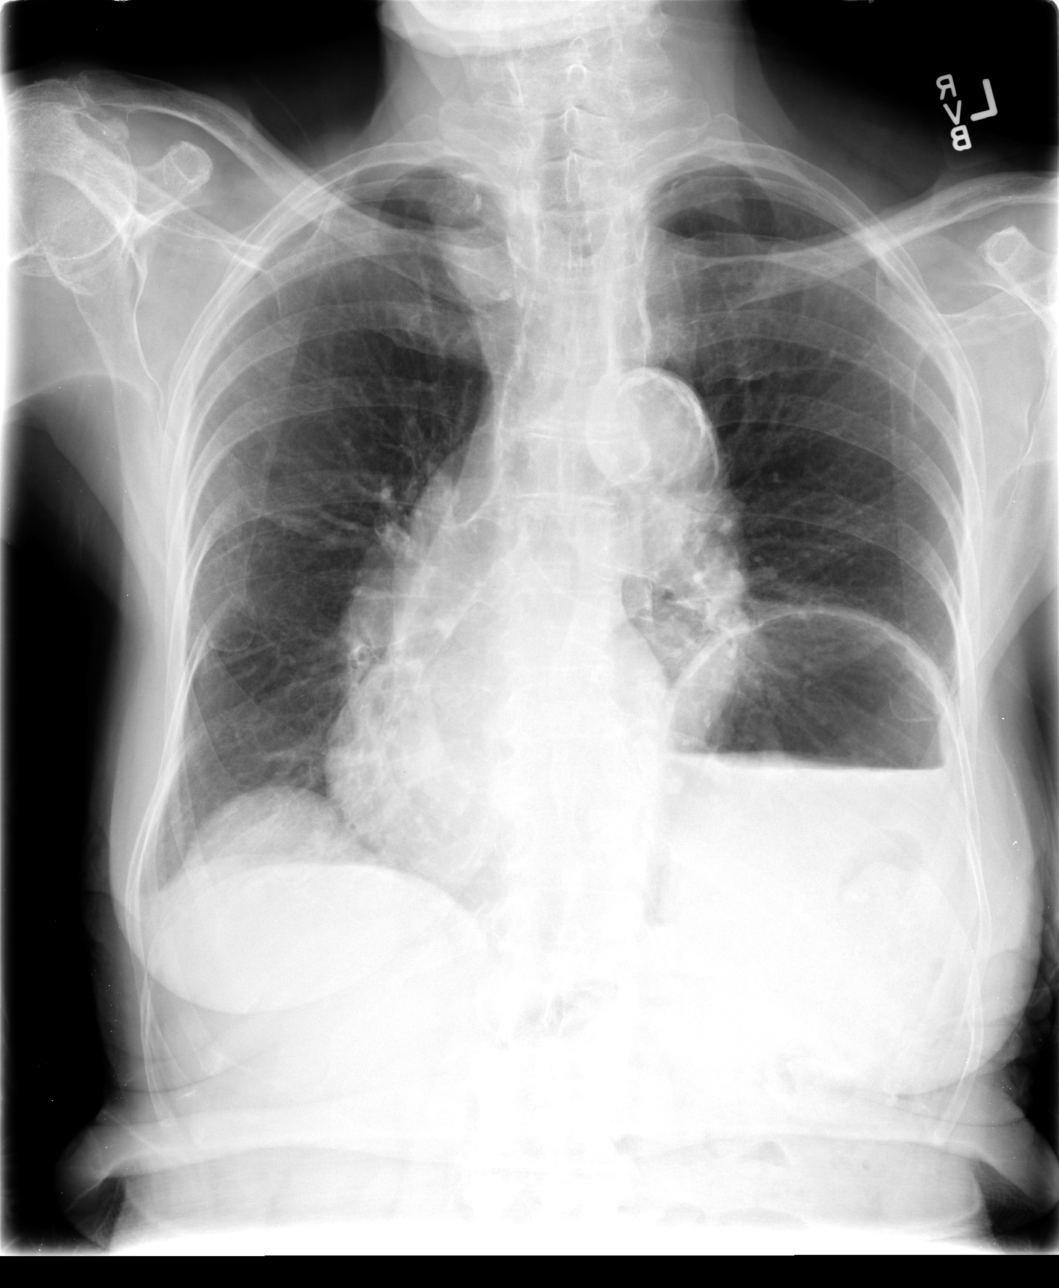

[view not recorded (2 of 3)]
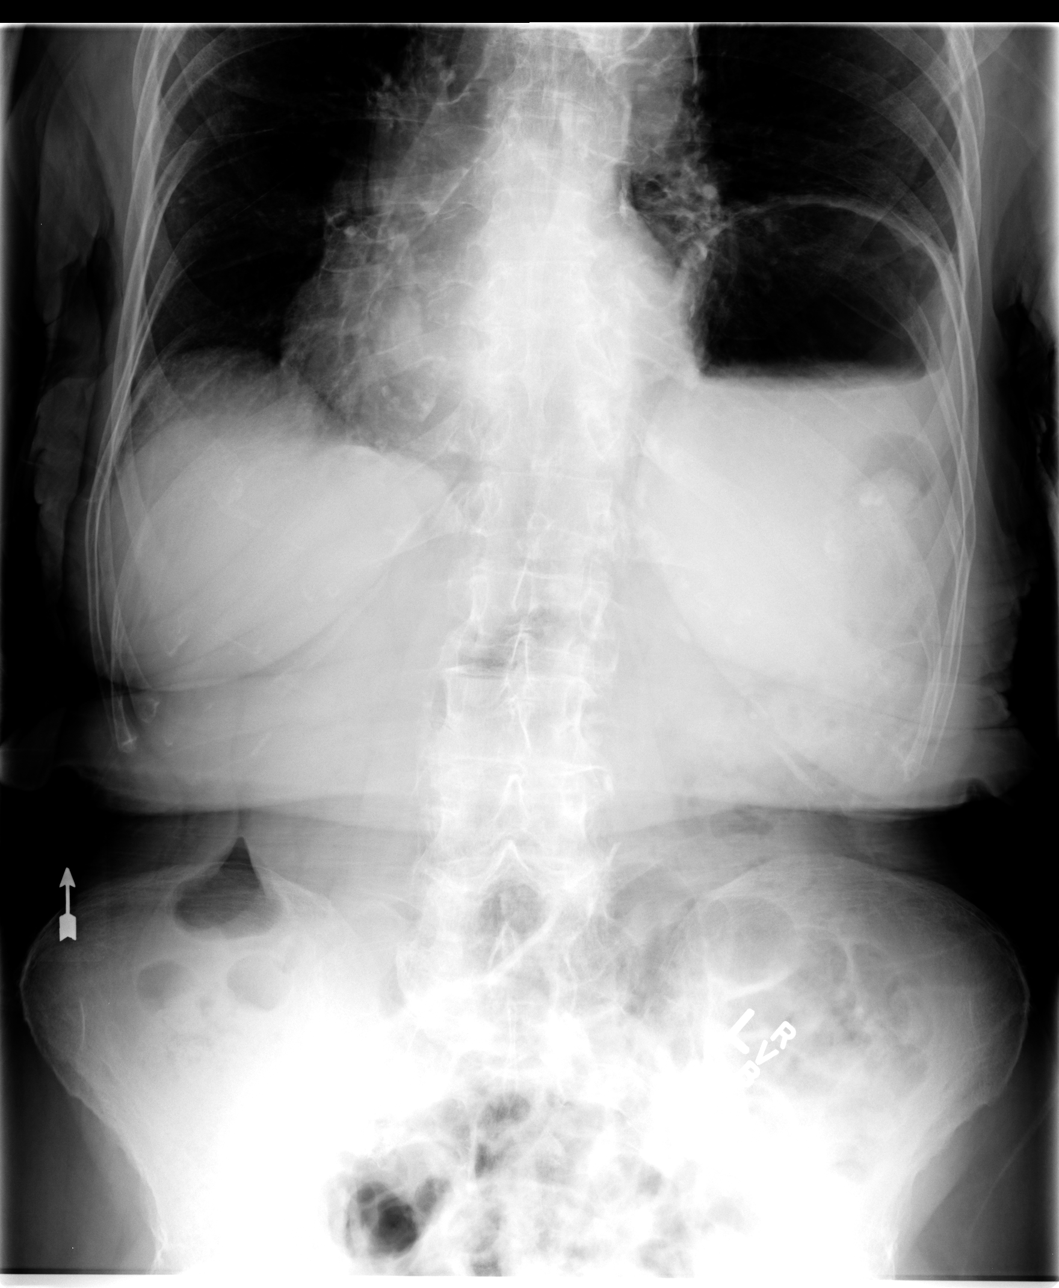

[view not recorded (3 of 3)]
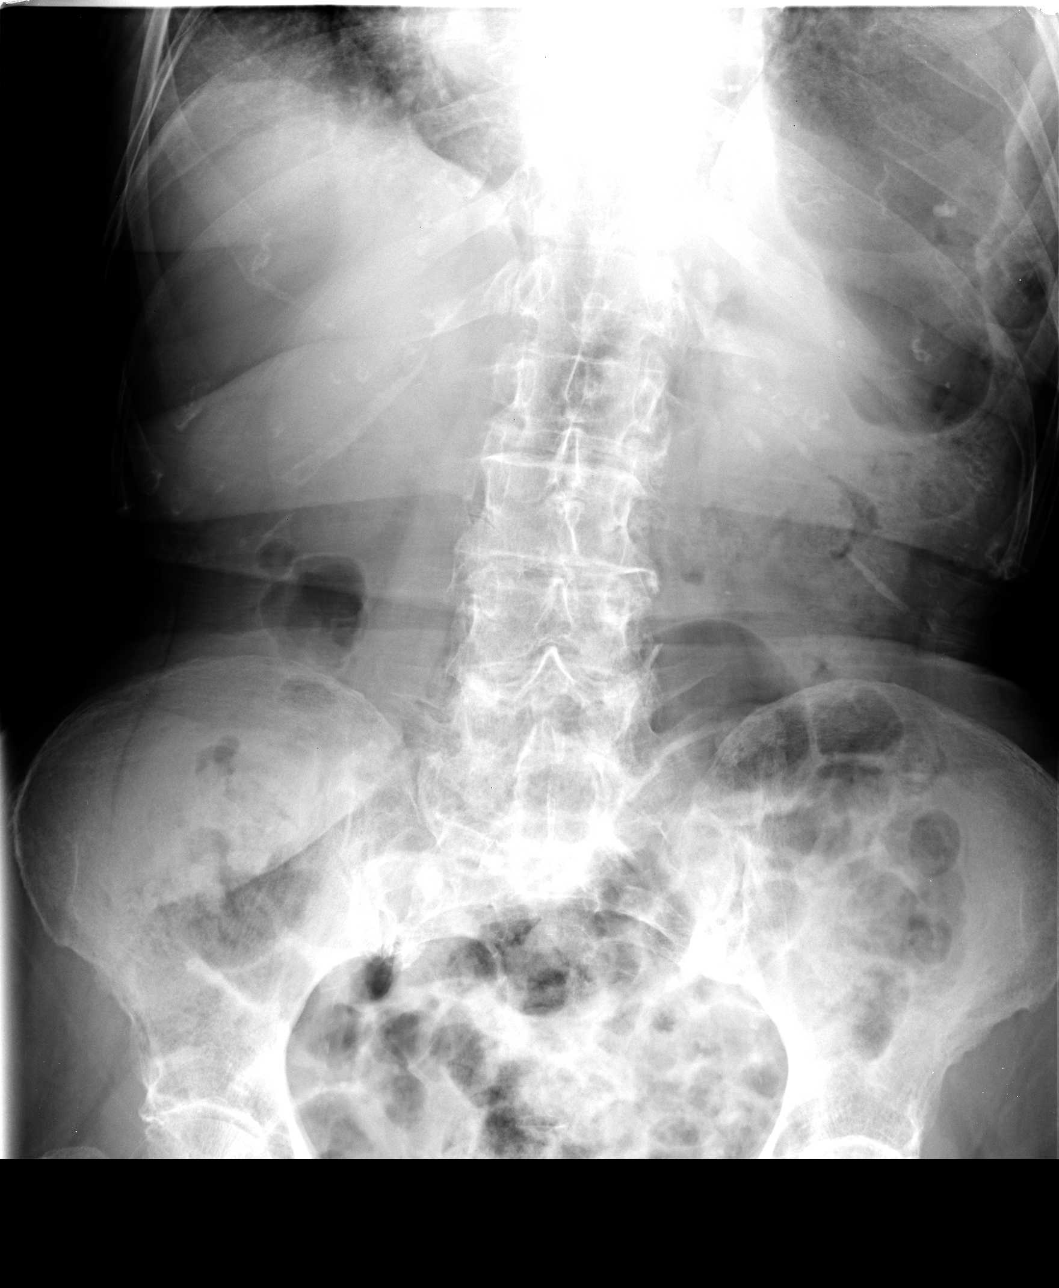

[3 of 3 positions shown; findings below may reference images not displayed]

FINDINGS: Left basilar atelectasis is noted, reflecting elevation
of the left hemidiaphragm.  There is no evidence of pleural
effusion or pneumothorax.  The cardiomediastinal silhouette is
normal in size, though shifted to the right due to the elevated
left hemidiaphragm.

The stomach is filled with fluid and air.  The visualized bowel gas
pattern is otherwise unremarkable.  Stool and air are noted
throughout the colon; there is no evidence of small bowel
dilatation to suggest obstruction.  No free intra-abdominal air is
identified on the provided upright view.

No acute osseous abnormalities are seen; the sacroiliac joints are
unremarkable in appearance.
IMPRESSION: 1.  Elevation of the left hemidiaphragm; the stomach is mildly
distended with fluid and air.
2.  Otherwise unremarkable bowel gas pattern; no free intra-
abdominal air seen.
3.  Left basilar atelectasis; lungs otherwise grossly clear.

## 2013-11-30 DIAGNOSIS — N39498 Other specified urinary incontinence: Secondary | ICD-10-CM | POA: Diagnosis not present

## 2013-11-30 DIAGNOSIS — R7309 Other abnormal glucose: Secondary | ICD-10-CM | POA: Diagnosis not present

## 2013-11-30 DIAGNOSIS — IMO0002 Reserved for concepts with insufficient information to code with codable children: Secondary | ICD-10-CM | POA: Diagnosis not present

## 2013-11-30 DIAGNOSIS — E7800 Pure hypercholesterolemia, unspecified: Secondary | ICD-10-CM | POA: Diagnosis not present

## 2013-11-30 DIAGNOSIS — E039 Hypothyroidism, unspecified: Secondary | ICD-10-CM | POA: Diagnosis not present

## 2013-11-30 DIAGNOSIS — R5383 Other fatigue: Secondary | ICD-10-CM | POA: Diagnosis not present

## 2013-11-30 DIAGNOSIS — R799 Abnormal finding of blood chemistry, unspecified: Secondary | ICD-10-CM | POA: Diagnosis not present

## 2013-11-30 DIAGNOSIS — I10 Essential (primary) hypertension: Secondary | ICD-10-CM | POA: Diagnosis not present

## 2013-11-30 DIAGNOSIS — R55 Syncope and collapse: Secondary | ICD-10-CM | POA: Diagnosis not present

## 2013-11-30 DIAGNOSIS — F0280 Dementia in other diseases classified elsewhere without behavioral disturbance: Secondary | ICD-10-CM | POA: Diagnosis not present

## 2013-11-30 DIAGNOSIS — R634 Abnormal weight loss: Secondary | ICD-10-CM | POA: Diagnosis not present

## 2013-11-30 DIAGNOSIS — R5381 Other malaise: Secondary | ICD-10-CM | POA: Diagnosis not present

## 2013-11-30 DIAGNOSIS — F410 Panic disorder [episodic paroxysmal anxiety] without agoraphobia: Secondary | ICD-10-CM | POA: Diagnosis not present

## 2013-11-30 DIAGNOSIS — Z23 Encounter for immunization: Secondary | ICD-10-CM | POA: Diagnosis not present

## 2014-03-01 DIAGNOSIS — R32 Unspecified urinary incontinence: Secondary | ICD-10-CM | POA: Diagnosis not present

## 2014-03-01 DIAGNOSIS — Z23 Encounter for immunization: Secondary | ICD-10-CM | POA: Diagnosis not present

## 2014-03-01 DIAGNOSIS — I10 Essential (primary) hypertension: Secondary | ICD-10-CM | POA: Diagnosis not present

## 2014-03-01 DIAGNOSIS — F0390 Unspecified dementia without behavioral disturbance: Secondary | ICD-10-CM | POA: Diagnosis not present

## 2014-03-30 DIAGNOSIS — N390 Urinary tract infection, site not specified: Secondary | ICD-10-CM | POA: Diagnosis not present

## 2014-03-30 DIAGNOSIS — R319 Hematuria, unspecified: Secondary | ICD-10-CM | POA: Diagnosis not present

## 2014-03-30 DIAGNOSIS — I10 Essential (primary) hypertension: Secondary | ICD-10-CM | POA: Diagnosis not present

## 2014-05-09 DIAGNOSIS — Z0000 Encounter for general adult medical examination without abnormal findings: Secondary | ICD-10-CM | POA: Diagnosis not present

## 2014-06-24 DIAGNOSIS — M1990 Unspecified osteoarthritis, unspecified site: Secondary | ICD-10-CM | POA: Diagnosis not present

## 2014-06-24 DIAGNOSIS — I10 Essential (primary) hypertension: Secondary | ICD-10-CM | POA: Diagnosis not present

## 2014-07-20 DIAGNOSIS — I10 Essential (primary) hypertension: Secondary | ICD-10-CM | POA: Diagnosis not present

## 2014-07-20 DIAGNOSIS — M1990 Unspecified osteoarthritis, unspecified site: Secondary | ICD-10-CM | POA: Diagnosis not present

## 2014-08-27 DIAGNOSIS — I10 Essential (primary) hypertension: Secondary | ICD-10-CM | POA: Diagnosis not present

## 2014-08-27 DIAGNOSIS — M1990 Unspecified osteoarthritis, unspecified site: Secondary | ICD-10-CM | POA: Diagnosis not present

## 2014-08-27 DIAGNOSIS — F0390 Unspecified dementia without behavioral disturbance: Secondary | ICD-10-CM | POA: Diagnosis not present

## 2014-08-30 DIAGNOSIS — Z961 Presence of intraocular lens: Secondary | ICD-10-CM | POA: Diagnosis not present

## 2014-09-11 DIAGNOSIS — W19XXXA Unspecified fall, initial encounter: Secondary | ICD-10-CM | POA: Diagnosis not present

## 2014-09-11 DIAGNOSIS — Y92019 Unspecified place in single-family (private) house as the place of occurrence of the external cause: Secondary | ICD-10-CM | POA: Diagnosis not present

## 2014-09-11 DIAGNOSIS — S0181XA Laceration without foreign body of other part of head, initial encounter: Secondary | ICD-10-CM | POA: Diagnosis not present

## 2014-09-11 DIAGNOSIS — S199XXA Unspecified injury of neck, initial encounter: Secondary | ICD-10-CM | POA: Diagnosis not present

## 2014-09-11 DIAGNOSIS — Z9181 History of falling: Secondary | ICD-10-CM | POA: Diagnosis not present

## 2014-09-11 DIAGNOSIS — S0990XA Unspecified injury of head, initial encounter: Secondary | ICD-10-CM | POA: Diagnosis not present

## 2014-09-11 DIAGNOSIS — S0530XA Ocular laceration without prolapse or loss of intraocular tissue, unspecified eye, initial encounter: Secondary | ICD-10-CM | POA: Diagnosis not present

## 2014-09-12 ENCOUNTER — Encounter (INDEPENDENT_AMBULATORY_CARE_PROVIDER_SITE_OTHER): Payer: Medicare Other | Admitting: Ophthalmology

## 2014-09-12 DIAGNOSIS — I10 Essential (primary) hypertension: Secondary | ICD-10-CM | POA: Diagnosis not present

## 2014-09-12 DIAGNOSIS — H43812 Vitreous degeneration, left eye: Secondary | ICD-10-CM | POA: Diagnosis not present

## 2014-09-12 DIAGNOSIS — H43813 Vitreous degeneration, bilateral: Secondary | ICD-10-CM

## 2014-09-12 DIAGNOSIS — H35033 Hypertensive retinopathy, bilateral: Secondary | ICD-10-CM

## 2014-09-12 DIAGNOSIS — H34813 Central retinal vein occlusion, bilateral: Secondary | ICD-10-CM | POA: Diagnosis not present

## 2014-09-16 DIAGNOSIS — I10 Essential (primary) hypertension: Secondary | ICD-10-CM | POA: Diagnosis not present

## 2014-09-16 DIAGNOSIS — Z993 Dependence on wheelchair: Secondary | ICD-10-CM | POA: Diagnosis not present

## 2014-09-16 DIAGNOSIS — F0391 Unspecified dementia with behavioral disturbance: Secondary | ICD-10-CM | POA: Diagnosis not present

## 2014-09-16 DIAGNOSIS — F341 Dysthymic disorder: Secondary | ICD-10-CM | POA: Diagnosis not present

## 2014-09-26 DIAGNOSIS — I739 Peripheral vascular disease, unspecified: Secondary | ICD-10-CM | POA: Diagnosis not present

## 2014-09-26 DIAGNOSIS — M1990 Unspecified osteoarthritis, unspecified site: Secondary | ICD-10-CM | POA: Diagnosis not present

## 2014-09-26 DIAGNOSIS — I10 Essential (primary) hypertension: Secondary | ICD-10-CM | POA: Diagnosis not present

## 2014-10-27 DIAGNOSIS — I10 Essential (primary) hypertension: Secondary | ICD-10-CM | POA: Diagnosis not present

## 2014-10-27 DIAGNOSIS — M1990 Unspecified osteoarthritis, unspecified site: Secondary | ICD-10-CM | POA: Diagnosis not present

## 2014-11-26 DIAGNOSIS — M1990 Unspecified osteoarthritis, unspecified site: Secondary | ICD-10-CM | POA: Diagnosis not present

## 2014-11-26 DIAGNOSIS — I10 Essential (primary) hypertension: Secondary | ICD-10-CM | POA: Diagnosis not present

## 2014-12-27 DIAGNOSIS — M1990 Unspecified osteoarthritis, unspecified site: Secondary | ICD-10-CM | POA: Diagnosis not present

## 2014-12-27 DIAGNOSIS — I10 Essential (primary) hypertension: Secondary | ICD-10-CM | POA: Diagnosis not present

## 2015-01-27 DIAGNOSIS — I10 Essential (primary) hypertension: Secondary | ICD-10-CM | POA: Diagnosis not present

## 2015-01-27 DIAGNOSIS — M1990 Unspecified osteoarthritis, unspecified site: Secondary | ICD-10-CM | POA: Diagnosis not present

## 2015-02-26 DIAGNOSIS — I10 Essential (primary) hypertension: Secondary | ICD-10-CM | POA: Diagnosis not present

## 2015-02-26 DIAGNOSIS — M1990 Unspecified osteoarthritis, unspecified site: Secondary | ICD-10-CM | POA: Diagnosis not present

## 2015-03-09 DIAGNOSIS — Z23 Encounter for immunization: Secondary | ICD-10-CM | POA: Diagnosis not present

## 2015-03-09 DIAGNOSIS — F0391 Unspecified dementia with behavioral disturbance: Secondary | ICD-10-CM | POA: Diagnosis not present

## 2015-03-09 DIAGNOSIS — R32 Unspecified urinary incontinence: Secondary | ICD-10-CM | POA: Diagnosis not present

## 2015-03-09 DIAGNOSIS — R634 Abnormal weight loss: Secondary | ICD-10-CM | POA: Diagnosis not present

## 2015-03-09 DIAGNOSIS — I10 Essential (primary) hypertension: Secondary | ICD-10-CM | POA: Diagnosis not present

## 2015-04-08 DIAGNOSIS — I739 Peripheral vascular disease, unspecified: Secondary | ICD-10-CM | POA: Diagnosis not present

## 2015-04-08 DIAGNOSIS — F0391 Unspecified dementia with behavioral disturbance: Secondary | ICD-10-CM | POA: Diagnosis not present

## 2015-04-08 DIAGNOSIS — I10 Essential (primary) hypertension: Secondary | ICD-10-CM | POA: Diagnosis not present

## 2015-05-09 DIAGNOSIS — F0391 Unspecified dementia with behavioral disturbance: Secondary | ICD-10-CM | POA: Diagnosis not present

## 2015-05-09 DIAGNOSIS — I10 Essential (primary) hypertension: Secondary | ICD-10-CM | POA: Diagnosis not present

## 2015-06-09 DIAGNOSIS — F0391 Unspecified dementia with behavioral disturbance: Secondary | ICD-10-CM | POA: Diagnosis not present

## 2015-06-09 DIAGNOSIS — I10 Essential (primary) hypertension: Secondary | ICD-10-CM | POA: Diagnosis not present

## 2015-07-27 DIAGNOSIS — F419 Anxiety disorder, unspecified: Secondary | ICD-10-CM | POA: Diagnosis not present

## 2015-07-27 DIAGNOSIS — I10 Essential (primary) hypertension: Secondary | ICD-10-CM | POA: Diagnosis not present

## 2015-09-06 DIAGNOSIS — F0391 Unspecified dementia with behavioral disturbance: Secondary | ICD-10-CM | POA: Diagnosis not present

## 2015-09-06 DIAGNOSIS — R634 Abnormal weight loss: Secondary | ICD-10-CM | POA: Diagnosis not present

## 2015-09-06 DIAGNOSIS — R32 Unspecified urinary incontinence: Secondary | ICD-10-CM | POA: Diagnosis not present

## 2015-09-06 DIAGNOSIS — E785 Hyperlipidemia, unspecified: Secondary | ICD-10-CM | POA: Diagnosis not present

## 2015-09-06 DIAGNOSIS — E46 Unspecified protein-calorie malnutrition: Secondary | ICD-10-CM | POA: Diagnosis not present

## 2015-09-06 DIAGNOSIS — Z0000 Encounter for general adult medical examination without abnormal findings: Secondary | ICD-10-CM | POA: Diagnosis not present

## 2015-10-07 DIAGNOSIS — I10 Essential (primary) hypertension: Secondary | ICD-10-CM | POA: Diagnosis not present

## 2015-10-07 DIAGNOSIS — F0391 Unspecified dementia with behavioral disturbance: Secondary | ICD-10-CM | POA: Diagnosis not present

## 2015-11-06 DIAGNOSIS — F0391 Unspecified dementia with behavioral disturbance: Secondary | ICD-10-CM | POA: Diagnosis not present

## 2015-11-06 DIAGNOSIS — I10 Essential (primary) hypertension: Secondary | ICD-10-CM | POA: Diagnosis not present

## 2015-12-07 DIAGNOSIS — F0391 Unspecified dementia with behavioral disturbance: Secondary | ICD-10-CM | POA: Diagnosis not present

## 2015-12-07 DIAGNOSIS — I10 Essential (primary) hypertension: Secondary | ICD-10-CM | POA: Diagnosis not present

## 2016-01-07 DIAGNOSIS — I10 Essential (primary) hypertension: Secondary | ICD-10-CM | POA: Diagnosis not present

## 2016-01-07 DIAGNOSIS — F0391 Unspecified dementia with behavioral disturbance: Secondary | ICD-10-CM | POA: Diagnosis not present

## 2016-02-06 DIAGNOSIS — F0391 Unspecified dementia with behavioral disturbance: Secondary | ICD-10-CM | POA: Diagnosis not present

## 2016-02-06 DIAGNOSIS — I10 Essential (primary) hypertension: Secondary | ICD-10-CM | POA: Diagnosis not present

## 2016-04-02 DIAGNOSIS — I10 Essential (primary) hypertension: Secondary | ICD-10-CM | POA: Diagnosis not present

## 2016-04-02 DIAGNOSIS — G301 Alzheimer's disease with late onset: Secondary | ICD-10-CM | POA: Diagnosis not present

## 2016-04-02 DIAGNOSIS — R32 Unspecified urinary incontinence: Secondary | ICD-10-CM | POA: Diagnosis not present

## 2016-04-02 DIAGNOSIS — R627 Adult failure to thrive: Secondary | ICD-10-CM | POA: Diagnosis not present

## 2016-05-03 DIAGNOSIS — R627 Adult failure to thrive: Secondary | ICD-10-CM | POA: Diagnosis not present

## 2016-05-03 DIAGNOSIS — G301 Alzheimer's disease with late onset: Secondary | ICD-10-CM | POA: Diagnosis not present

## 2016-06-03 DIAGNOSIS — G301 Alzheimer's disease with late onset: Secondary | ICD-10-CM | POA: Diagnosis not present

## 2016-06-03 DIAGNOSIS — R627 Adult failure to thrive: Secondary | ICD-10-CM | POA: Diagnosis not present

## 2016-07-01 DIAGNOSIS — R627 Adult failure to thrive: Secondary | ICD-10-CM | POA: Diagnosis not present

## 2016-07-01 DIAGNOSIS — G301 Alzheimer's disease with late onset: Secondary | ICD-10-CM | POA: Diagnosis not present

## 2016-08-01 DIAGNOSIS — R627 Adult failure to thrive: Secondary | ICD-10-CM | POA: Diagnosis not present

## 2016-08-01 DIAGNOSIS — G301 Alzheimer's disease with late onset: Secondary | ICD-10-CM | POA: Diagnosis not present

## 2016-08-31 DIAGNOSIS — G301 Alzheimer's disease with late onset: Secondary | ICD-10-CM | POA: Diagnosis not present

## 2016-08-31 DIAGNOSIS — I10 Essential (primary) hypertension: Secondary | ICD-10-CM | POA: Diagnosis not present

## 2016-10-01 DIAGNOSIS — I10 Essential (primary) hypertension: Secondary | ICD-10-CM | POA: Diagnosis not present

## 2016-10-01 DIAGNOSIS — I739 Peripheral vascular disease, unspecified: Secondary | ICD-10-CM | POA: Diagnosis not present

## 2016-11-08 DIAGNOSIS — R627 Adult failure to thrive: Secondary | ICD-10-CM | POA: Diagnosis not present

## 2016-11-08 DIAGNOSIS — I10 Essential (primary) hypertension: Secondary | ICD-10-CM | POA: Diagnosis not present

## 2016-11-08 DIAGNOSIS — G301 Alzheimer's disease with late onset: Secondary | ICD-10-CM | POA: Diagnosis not present

## 2016-11-08 DIAGNOSIS — R32 Unspecified urinary incontinence: Secondary | ICD-10-CM | POA: Diagnosis not present

## 2017-01-09 DIAGNOSIS — R627 Adult failure to thrive: Secondary | ICD-10-CM | POA: Diagnosis not present

## 2017-01-09 DIAGNOSIS — I10 Essential (primary) hypertension: Secondary | ICD-10-CM | POA: Diagnosis not present

## 2017-02-08 DIAGNOSIS — R627 Adult failure to thrive: Secondary | ICD-10-CM | POA: Diagnosis not present

## 2017-02-08 DIAGNOSIS — I10 Essential (primary) hypertension: Secondary | ICD-10-CM | POA: Diagnosis not present

## 2017-03-15 ENCOUNTER — Emergency Department (HOSPITAL_COMMUNITY): Payer: Medicare Other

## 2017-03-15 ENCOUNTER — Other Ambulatory Visit: Payer: Self-pay

## 2017-03-15 ENCOUNTER — Encounter (HOSPITAL_COMMUNITY): Payer: Self-pay | Admitting: Emergency Medicine

## 2017-03-15 ENCOUNTER — Inpatient Hospital Stay (HOSPITAL_COMMUNITY)
Admission: EM | Admit: 2017-03-15 | Discharge: 2017-03-29 | DRG: 640 | Disposition: E | Payer: Medicare Other | Attending: Internal Medicine | Admitting: Internal Medicine

## 2017-03-15 DIAGNOSIS — E871 Hypo-osmolality and hyponatremia: Secondary | ICD-10-CM | POA: Diagnosis present

## 2017-03-15 DIAGNOSIS — Z515 Encounter for palliative care: Secondary | ICD-10-CM | POA: Diagnosis present

## 2017-03-15 DIAGNOSIS — J9621 Acute and chronic respiratory failure with hypoxia: Secondary | ICD-10-CM | POA: Diagnosis present

## 2017-03-15 DIAGNOSIS — R627 Adult failure to thrive: Secondary | ICD-10-CM | POA: Diagnosis present

## 2017-03-15 DIAGNOSIS — E870 Hyperosmolality and hypernatremia: Secondary | ICD-10-CM | POA: Diagnosis present

## 2017-03-15 DIAGNOSIS — Z681 Body mass index (BMI) 19 or less, adult: Secondary | ICD-10-CM | POA: Diagnosis not present

## 2017-03-15 DIAGNOSIS — R1310 Dysphagia, unspecified: Secondary | ICD-10-CM | POA: Diagnosis not present

## 2017-03-15 DIAGNOSIS — R6251 Failure to thrive (child): Secondary | ICD-10-CM | POA: Diagnosis present

## 2017-03-15 DIAGNOSIS — J9600 Acute respiratory failure, unspecified whether with hypoxia or hypercapnia: Secondary | ICD-10-CM

## 2017-03-15 DIAGNOSIS — R64 Cachexia: Secondary | ICD-10-CM | POA: Diagnosis present

## 2017-03-15 DIAGNOSIS — Z79899 Other long term (current) drug therapy: Secondary | ICD-10-CM | POA: Diagnosis not present

## 2017-03-15 DIAGNOSIS — N183 Chronic kidney disease, stage 3 (moderate): Secondary | ICD-10-CM | POA: Diagnosis present

## 2017-03-15 DIAGNOSIS — I129 Hypertensive chronic kidney disease with stage 1 through stage 4 chronic kidney disease, or unspecified chronic kidney disease: Secondary | ICD-10-CM | POA: Diagnosis present

## 2017-03-15 DIAGNOSIS — D696 Thrombocytopenia, unspecified: Secondary | ICD-10-CM | POA: Diagnosis not present

## 2017-03-15 DIAGNOSIS — R0602 Shortness of breath: Secondary | ICD-10-CM | POA: Diagnosis not present

## 2017-03-15 DIAGNOSIS — Z66 Do not resuscitate: Secondary | ICD-10-CM | POA: Diagnosis present

## 2017-03-15 DIAGNOSIS — T17908A Unspecified foreign body in respiratory tract, part unspecified causing other injury, initial encounter: Secondary | ICD-10-CM | POA: Diagnosis not present

## 2017-03-15 DIAGNOSIS — R05 Cough: Secondary | ICD-10-CM | POA: Diagnosis not present

## 2017-03-15 DIAGNOSIS — F0390 Unspecified dementia without behavioral disturbance: Secondary | ICD-10-CM | POA: Diagnosis present

## 2017-03-15 DIAGNOSIS — E872 Acidosis: Secondary | ICD-10-CM | POA: Diagnosis present

## 2017-03-15 DIAGNOSIS — Z7982 Long term (current) use of aspirin: Secondary | ICD-10-CM

## 2017-03-15 DIAGNOSIS — R633 Feeding difficulties: Secondary | ICD-10-CM | POA: Diagnosis present

## 2017-03-15 DIAGNOSIS — E860 Dehydration: Secondary | ICD-10-CM | POA: Diagnosis present

## 2017-03-15 DIAGNOSIS — Z993 Dependence on wheelchair: Secondary | ICD-10-CM | POA: Diagnosis not present

## 2017-03-15 DIAGNOSIS — Z90710 Acquired absence of both cervix and uterus: Secondary | ICD-10-CM

## 2017-03-15 DIAGNOSIS — I10 Essential (primary) hypertension: Secondary | ICD-10-CM | POA: Diagnosis present

## 2017-03-15 LAB — TROPONIN I: Troponin I: 0.05 ng/mL (ref <0.03)

## 2017-03-15 LAB — CBC WITH DIFFERENTIAL/PLATELET
Basophils Absolute: 0 10*3/uL (ref 0.0–0.1)
Basophils Relative: 0 %
EOS PCT: 0 %
Eosinophils Absolute: 0 10*3/uL (ref 0.0–0.7)
HCT: 46.5 % — ABNORMAL HIGH (ref 36.0–46.0)
HEMOGLOBIN: 13.8 g/dL (ref 12.0–15.0)
LYMPHS ABS: 1.7 10*3/uL (ref 0.7–4.0)
LYMPHS PCT: 14 %
MCH: 30.3 pg (ref 26.0–34.0)
MCHC: 29.7 g/dL — AB (ref 30.0–36.0)
MCV: 102.2 fL — AB (ref 78.0–100.0)
MONOS PCT: 2 %
Monocytes Absolute: 0.3 10*3/uL (ref 0.1–1.0)
Neutro Abs: 10.7 10*3/uL — ABNORMAL HIGH (ref 1.7–7.7)
Neutrophils Relative %: 84 %
PLATELETS: 84 10*3/uL — AB (ref 150–400)
RBC: 4.55 MIL/uL (ref 3.87–5.11)
RDW: 15.8 % — ABNORMAL HIGH (ref 11.5–15.5)
WBC: 12.8 10*3/uL — AB (ref 4.0–10.5)

## 2017-03-15 LAB — COMPREHENSIVE METABOLIC PANEL
ALBUMIN: 3.3 g/dL — AB (ref 3.5–5.0)
ALT: 15 U/L (ref 14–54)
AST: 31 U/L (ref 15–41)
Alkaline Phosphatase: 64 U/L (ref 38–126)
Anion gap: 7 (ref 5–15)
BUN: 34 mg/dL — AB (ref 6–20)
CHLORIDE: 129 mmol/L — AB (ref 101–111)
CO2: 31 mmol/L (ref 22–32)
Calcium: 8.8 mg/dL — ABNORMAL LOW (ref 8.9–10.3)
Creatinine, Ser: 1.14 mg/dL — ABNORMAL HIGH (ref 0.44–1.00)
GFR calc Af Amer: 46 mL/min — ABNORMAL LOW (ref >60)
GFR, EST NON AFRICAN AMERICAN: 40 mL/min — AB (ref >60)
Glucose, Bld: 200 mg/dL — ABNORMAL HIGH (ref 65–99)
POTASSIUM: 3.4 mmol/L — AB (ref 3.5–5.1)
SODIUM: 167 mmol/L — AB (ref 135–145)
Total Bilirubin: 0.9 mg/dL (ref 0.3–1.2)
Total Protein: 6.6 g/dL (ref 6.5–8.1)

## 2017-03-15 LAB — LACTIC ACID, PLASMA
LACTIC ACID, VENOUS: 2.5 mmol/L — AB (ref 0.5–1.9)
Lactic Acid, Venous: 2.5 mmol/L (ref 0.5–1.9)

## 2017-03-15 LAB — BRAIN NATRIURETIC PEPTIDE: B Natriuretic Peptide: 189 pg/mL — ABNORMAL HIGH (ref 0.0–100.0)

## 2017-03-15 MED ORDER — ONDANSETRON HCL 4 MG PO TABS: 4.0000 mg | ORAL_TABLET | Freq: Four times a day (QID) | ORAL | Status: DC | PRN

## 2017-03-15 MED ORDER — SODIUM CHLORIDE 0.9% FLUSH
3.0000 mL | Freq: Two times a day (BID) | INTRAVENOUS | Status: DC
  Administered 2017-03-15 – 2017-03-16 (×2): 3 mL via INTRAVENOUS

## 2017-03-15 MED ORDER — PIPERACILLIN-TAZOBACTAM 3.375 G IVPB 30 MIN
3.3750 g | Freq: Once | INTRAVENOUS | Status: AC
  Administered 2017-03-15: 3.375 g via INTRAVENOUS
  Filled 2017-03-15: qty 50

## 2017-03-15 MED ORDER — SODIUM CHLORIDE 0.9 % IV BOLUS (SEPSIS)
1000.0000 mL | Freq: Once | INTRAVENOUS | Status: AC
  Administered 2017-03-15: 1000 mL via INTRAVENOUS

## 2017-03-15 MED ORDER — ALBUTEROL SULFATE (2.5 MG/3ML) 0.083% IN NEBU: 2.5000 mg | INHALATION_SOLUTION | RESPIRATORY_TRACT | Status: DC | PRN

## 2017-03-15 MED ORDER — SODIUM CHLORIDE 0.9 % IV SOLN: 250.0000 mL | INTRAVENOUS | Status: DC | PRN

## 2017-03-15 MED ORDER — LORAZEPAM 0.5 MG PO TABS: 0.5000 mg | ORAL_TABLET | Freq: Four times a day (QID) | ORAL | Status: DC | PRN

## 2017-03-15 MED ORDER — ONDANSETRON HCL 4 MG/2ML IJ SOLN: 4.0000 mg | Freq: Four times a day (QID) | INTRAMUSCULAR | Status: DC | PRN

## 2017-03-15 MED ORDER — SODIUM CHLORIDE 0.9% FLUSH: 3.0000 mL | INTRAVENOUS | Status: DC | PRN

## 2017-03-15 MED ORDER — VANCOMYCIN HCL IN DEXTROSE 1-5 GM/200ML-% IV SOLN
1000.0000 mg | Freq: Once | INTRAVENOUS | Status: AC
  Administered 2017-03-15: 1000 mg via INTRAVENOUS
  Filled 2017-03-15: qty 200

## 2017-03-15 MED ORDER — ACETAMINOPHEN 325 MG PO TABS: 650.0000 mg | ORAL_TABLET | Freq: Four times a day (QID) | ORAL | Status: DC | PRN

## 2017-03-15 MED ORDER — ACETAMINOPHEN 650 MG RE SUPP: 650.0000 mg | Freq: Four times a day (QID) | RECTAL | Status: DC | PRN

## 2017-03-15 MED ORDER — MORPHINE SULFATE (CONCENTRATE) 10 MG/0.5ML PO SOLN: 10.0000 mg | ORAL | Status: DC | PRN

## 2017-03-15 NOTE — Progress Notes (Signed)
CRITICAL VALUE ALERT  Critical Value:  Lactic Acid 2.5  Date & Time Notied:  07/07/16, 1442  Provider Notified: Dr. Kerry HoughMemon  Orders Received/Actions taken:

## 2017-03-15 NOTE — H&P (Signed)
History and Physical    [name] ZOX:[id number] DOB: 07/28/1919 DOA: 03/14/2017  PCP: Avon Gully, MD  Patient coming from: Home  I have personally briefly reviewed patient's old medical records in [facility]  Chief Complaint: Shortness of breath  HPI: [name] is a 83 y.o. female with medical history significant of advanced dementia, hypertension who lives at home with her daughter.  Patient is becoming progressively weak over the past 2 weeks and has been unable to stand.  She has been wheelchair dependent for the past 5 years, but has been able to transfer from wheelchair to bed as well as take a few steps.  For the past 2 weeks, she has been completely wheelchair dependent.  The patient is unable to feed herself/dress herself.  At baseline, patient often does not recognize her daughter.  Her daughter also notes that for the past few weeks she has been refusing to eat, often holds the food in her mouth.  She has been losing weight.  She has not had any fever, vomiting, diarrhea.  On Monday, the daughter did note that patient had "gurgling sounds" from her throat and was noted to have significant secretions.  The daughter gave her Mucinex and encouraged her to cough with secretions subsequently resolving.  Today, she had a similar episode where had increased secretions.  She is brought to the hospital in respiratory distress.  On vitals check, oxygen saturations were noted to be in the 40s-50s on room air.  100% oxygen nonrebreather mask was applied with improvement of saturations.  Basic labs indicated sodium of 167, chloride 129 and a platelet count in the 80s.  Lactic acid elevated at 2.5.  Patient has been referred for admission.   Review of Systems: As per HPI otherwise 10 point review of systems negative.    Past Medical History:  Diagnosis Date  . Hypertension     Past Surgical History:  Procedure Laterality Date  . ABDOMINAL HYSTERECTOMY       reports that   has never smoked. she has never used smokeless tobacco. She reports that she does not drink alcohol or use drugs.  No Known Allergies  Family history: Family history reviewed and not pertinent  Prior to Admission medications   Medication Sig Start Date End Date Taking? Authorizing Provider  ALPRAZolam Prudy Feeler) 0.5 MG tablet  03/02/12   [provider]  amLODipine (NORVASC) 2.5 MG tablet Take 2.5 mg by mouth daily.    [provider]  aspirin EC 81 MG tablet Take 81 mg by mouth daily.      [provider]  Calcium Carbonate-Vitamin D (CALCIUM 600 + D PO) Take 1 tablet by mouth daily.      [provider]  cloNIDine (CATAPRES) 0.1 MG tablet Take 0.1 mg by mouth 2 (two) times daily.    [provider]  DETROL LA 4 MG 24 hr capsule  03/02/12   [provider]  docusate sodium 100 MG CAPS Take 100 mg by mouth 2 (two) times daily. 01/15/12   Vickki Hearing, MD  lisinopril (PRINIVIL,ZESTRIL) 40 MG tablet Take 40 mg by mouth daily.    [provider]  promethazine (PHENERGAN) 12.5 MG tablet Take 1 tablet (12.5 mg total) by mouth every 6 (six) hours as needed for nausea. 01/15/12   Vickki Hearing, MD    Physical Exam: Vitals:   03/01/2017 1446 03/08/2017 1500 03/11/2017 1508 03/09/2017 1530  BP:  (!) 182/82  Marland Kitchen)  157/84  Pulse: 84 86 84 84  Resp: 18 18 17 18   Temp:      TempSrc:      SpO2: 93% 94% 93% 100%  Weight:    40.8 kg (90 lb 0 oz)  Height:    5\' 4"  (1.626 m)    Constitutional: NAD, calm, comfortable Vitals:   03/14/2017 1446 03/26/2017 1500 03/09/2017 1508 03/25/2017 1530  BP:  (!) 182/82  (!) 157/84  Pulse: 84 86 84 84  Resp: 18 18 17 18   Temp:      TempSrc:      SpO2: 93% 94% 93% 100%  Weight:    40.8 kg (90 lb 0 oz)  Height:    5\' 4"  (1.626 m)   Eyes: PERRL, lids and conjunctivae normal ENMT: Mucous membranes are dry. Posterior pharynx clear of any exudate or lesions.Normal dentition.  Neck: normal, supple, no masses,  no thyromegaly Respiratory: clear to auscultation bilaterally, no wheezing, no crackles. Normal respiratory effort. No accessory muscle use.  Cardiovascular: Regular rate and rhythm, no murmurs / rubs / gallops. No extremity edema. 2+ pedal pulses. No carotid bruits.  Abdomen: no tenderness, no masses palpated. No hepatosplenomegaly. Bowel sounds positive.  Musculoskeletal: no clubbing / cyanosis. No joint deformity upper and lower extremities. Good ROM, no contractures. Normal muscle tone.  Skin: no rashes, lesions, ulcers. No induration Neurologic: Limited exam due to mental status.  No gross deficits appreciated. Psychiatric: Nonverbal   Labs on Admission: I have personally reviewed following labs and imaging studies  CBC: Recent Labs  Lab 02/27/2017 1236  WBC 12.8*  NEUTROABS 10.7*  HGB 13.8  HCT 46.5*  MCV 102.2*  PLT 84*   Basic Metabolic Panel: Recent Labs  Lab 02/27/2017 1236  NA 167*  K 3.4*  CL 129*  CO2 31  GLUCOSE 200*  BUN 34*  CREATININE 1.14*  CALCIUM 8.8*   GFR: Estimated Creatinine Clearance: 19 mL/min (A) (by C-G formula based on SCr of 1.14 mg/dL (H)). Liver Function Tests: Recent Labs  Lab 03/22/2017 1236  AST 31  ALT 15  ALKPHOS 64  BILITOT 0.9  PROT 6.6  ALBUMIN 3.3*   No results for input(s): LIPASE, AMYLASE in the last 168 hours. No results for input(s): AMMONIA in the last 168 hours. Coagulation Profile: No results for input(s): INR, PROTIME in the last 168 hours. Cardiac Enzymes: Recent Labs  Lab 03/27/2017 1236  TROPONINI 0.05*   BNP (last 3 results) No results for input(s): PROBNP in the last 8760 hours. HbA1C: No results for input(s): HGBA1C in the last 72 hours. CBG: No results for input(s): GLUCAP in the last 168 hours. Lipid Profile: No results for input(s): CHOL, HDL, LDLCALC, TRIG, CHOLHDL, LDLDIRECT in the last 72 hours. Thyroid Function Tests: No results for input(s): TSH, T4TOTAL, FREET4, T3FREE, THYROIDAB in the last 72  hours. Anemia Panel: No results for input(s): VITAMINB12, FOLATE, FERRITIN, TIBC, IRON, RETICCTPCT in the last 72 hours. Urine analysis:    Component Value Date/Time   COLORURINE YELLOW 05/28/2010 1300   APPEARANCEUR CLOUDY (A) 05/28/2010 1300   LABSPEC 1.010 05/28/2010 1300   PHURINE 6.5 05/28/2010 1300   GLUCOSEU NEGATIVE 07/20/2007 1121   HGBUR NEGATIVE 05/28/2010 1300   BILIRUBINUR NEGATIVE 05/28/2010 1300   KETONESUR TRACE (A) 05/28/2010 1300   PROTEINUR NEGATIVE 05/28/2010 1300   UROBILINOGEN 0.2 05/28/2010 1300   NITRITE NEGATIVE 05/28/2010 1300   LEUKOCYTESUR  05/28/2010 1300    NEGATIVE MICROSCOPIC NOT DONE ON URINES WITH NEGATIVE  PROTEIN, BLOOD, LEUKOCYTES, NITRITE, OR GLUCOSE <1000 mg/dL.    Radiological Exams on Admission: Dg Chest Portable 1 View  Result Date: 2016/07/29 CLINICAL DATA:  Cough, congestion EXAM: PORTABLE CHEST 1 VIEW COMPARISON:  01/13/2012 FINDINGS: Elevation of the left hemidiaphragm. Left base atelectasis. Right lung clear. Mild cardiomegaly. Calcifications throughout the aorta. No effusions or acute bony abnormality. IMPRESSION: Stable elevation of the left hemidiaphragm.  Left base atelectasis. Mild cardiomegaly. Aortic atherosclerosis. Electronically Signed   By: Charlett NoseKevin  Dover M.D.   On: 2016/07/29 13:04    Assessment/Plan Active Problems:   Hypernatremia   Dementia   Essential hypertension   Failure to thrive (0-17)   Dysphagia   Acute on chronic respiratory failure with hypoxia (HCC)   Aspiration into respiratory tract   Thrombocytopenia (HCC)    1. Acute respiratory failure, likely due to aspiration related to dysphagia.  Currently on nonrebreather mask.  Will wean down as tolerated.  Discussed with daughter, she has noticed that the patient has becoming increasingly weak.  She likely has developed significant dysphagia which is leading to aspiration.  Keep the patient n.p.o. for now due to increased risk of aspiration.  Will provide swabs  for mouth for comfort. 2. Hypernatremia, likely related to dehydration and free water deficit. 3. Failure to thrive related to dementia and dysphagia. 4. Thrombus cytopenia.  Likely related to acute illness.  Baseline labs are not available since last labs in our system are from over 5 years ago. 5. Goals of care.  Patient's daughter is very realistic and reasonable in her wishes.  She does not want the patient to suffer.  She agrees to DNR and agrees that IV fluids will likely not change the patient's long-term course.  She admits that patient has been declining for quite some time and has had decreased p.o. intake.  Patient be appropriate for hospice services.  At this time, patient's daughter is agreeable to take the patient home with hospice services.  Will request case management to consult hospice services.  DVT prophylaxis: None, comfort care Code Status: DNR, comfort measures Family Communication: Discussed with daughter at the bedside Disposition Plan: Discharge home with hospice services Consults called:  Admission status: Observation, MedSurg   Erick BlinksJehanzeb Memon MD [facility] Pager [phone]  If 7PM-7AM, please contact night-coverage [email or web address] Password [email or web address]  2016/07/29, 5:35 PM

## 2017-03-15 NOTE — ED Provider Notes (Signed)
[facility]   CSN: [id number] Arrival date & time: 2016-11-18  1211     History   Chief Complaint Chief Complaint  Patient presents with  . Respiratory Distress    HPI [name] is a 83 y.o. female.  Patient brought in by family because the patient has been short of breath and congested.   The history is provided by a relative. No language interpreter was used.  Shortness of Breath  This is a new problem. The problem occurs frequently.The current episode started 3 to 5 hours ago. The problem has not changed since onset.Pertinent negatives include no fever. Precipitated by: Unknown. Risk factors: Elderly.    Past Medical History:  Diagnosis Date  . Hypertension     Patient Active Problem List   Diagnosis Date Noted  . Hypernatremia 01/16/17  . OA (osteoarthritis) of knee 04/27/2012  . Closed fracture of femoral condyle (HCC) 01/15/2012  . CLOSED FRACTURE OF METATARSAL BONE 10/24/2008    Past Surgical History:  Procedure Laterality Date  . ABDOMINAL HYSTERECTOMY      OB History    No data available       Home Medications    Prior to Admission medications   Medication Sig Start Date End Date Taking? Authorizing Provider  ALPRAZolam Prudy Feeler(XANAX) 0.5 MG tablet  03/02/12   [provider]  amLODipine (NORVASC) 2.5 MG tablet Take 2.5 mg by mouth daily.    [provider]  aspirin EC 81 MG tablet Take 81 mg by mouth daily.      [provider]  Calcium Carbonate-Vitamin D (CALCIUM 600 + D PO) Take 1 tablet by mouth daily.      [provider]  cloNIDine (CATAPRES) 0.1 MG tablet Take 0.1 mg by mouth 2 (two) times daily.    [provider]  DETROL LA 4 MG 24 hr capsule  03/02/12   [provider]  docusate sodium 100 MG CAPS Take 100 mg by mouth 2 (two) times daily. 01/15/12   Vickki HearingHarrison, Stanley E, MD  enoxaparin (LOVENOX) 30 MG/0.3ML injection Inject 0.3 mLs (30 mg total) into  the skin every 12 (twelve) hours. 01/15/12   Vickki HearingHarrison, Stanley E, MD  HYDROcodone-acetaminophen (NORCO/VICODIN) 5-325 MG per tablet Take 1 tablet by mouth every 6 (six) hours as needed. 04/09/12   Vickki HearingHarrison, Stanley E, MD  lisinopril (PRINIVIL,ZESTRIL) 40 MG tablet Take 40 mg by mouth daily.    [provider]  promethazine (PHENERGAN) 12.5 MG tablet Take 1 tablet (12.5 mg total) by mouth every 6 (six) hours as needed for nausea. 01/15/12   Vickki HearingHarrison, Stanley E, MD    Family History History reviewed. No pertinent family history.  Social History Social History   Tobacco Use  . Smoking status: Never Smoker  . Smokeless tobacco: Never Used  Substance Use Topics  . Alcohol use: No  . Drug use: No     Allergies   Patient has no known allergies.   Review of Systems Review of Systems  Unable to perform ROS: Mental status change  Constitutional: Negative for fever.  Respiratory: Positive for shortness of breath.      Physical Exam Updated Vital Signs BP (!) 154/81   Pulse 89   Temp 99.7 F (37.6 C) (Rectal)   Resp (!) 21   Wt 40.8 kg (90 lb)   SpO2 92%   BMI 17.01 kg/m   Physical Exam  Constitutional:  Cachectic  HENT:  Head: Normocephalic.  Eyes: Conjunctivae and EOM are normal. No scleral icterus.  Neck: Neck supple. No thyromegaly present.  Cardiovascular: Normal rate and regular rhythm. Exam reveals no gallop and no friction rub.  No murmur heard. Pulmonary/Chest: No stridor. She has no wheezes. She has rales. She exhibits no tenderness.  Patient is tachypneic and has rales in both lungs  Abdominal: She exhibits no distension. There is no tenderness. There is no rebound.  Musculoskeletal: Normal range of motion. She exhibits no edema.  Lymphadenopathy:    She has no cervical adenopathy.  Neurological: She exhibits normal muscle tone. Coordination normal.  Patient does not respond to any verbal stimuli only painful stimuli.  Her daughter states that she  never opens her eyes and she will only speak if she wants something.  Skin: No rash noted. No erythema.     ED Treatments / Results  Labs (all labs ordered are listed, but only abnormal results are displayed) Labs Reviewed  COMPREHENSIVE METABOLIC PANEL - Abnormal; Notable for the following components:      Result Value   Sodium 167 (*)    Potassium 3.4 (*)    Chloride 129 (*)    Glucose, Bld 200 (*)    BUN 34 (*)    Creatinine, Ser 1.14 (*)    Calcium 8.8 (*)    Albumin 3.3 (*)    GFR calc non Af Amer 40 (*)    GFR calc Af Amer 46 (*)    All other components within normal limits  CBC WITH DIFFERENTIAL/PLATELET - Abnormal; Notable for the following components:   WBC 12.8 (*)    HCT 46.5 (*)    MCV 102.2 (*)    MCHC 29.7 (*)    RDW 15.8 (*)    Platelets 84 (*)    Neutro Abs 10.7 (*)    All other components within normal limits  TROPONIN I - Abnormal; Notable for the following components:   Troponin I 0.05 (*)    All other components within normal limits  BRAIN NATRIURETIC PEPTIDE - Abnormal; Notable for the following components:   B Natriuretic Peptide 189.0 (*)    All other components within normal limits  LACTIC ACID, PLASMA - Abnormal; Notable for the following components:   Lactic Acid, Venous 2.5 (*)    All other components within normal limits  CULTURE, BLOOD (ROUTINE X 2)  CULTURE, BLOOD (ROUTINE X 2)  URINALYSIS, ROUTINE W REFLEX MICROSCOPIC  LACTIC ACID, PLASMA    EKG  EKG Interpretation  Date/Time:  Saturday 2017-04-05 12:22:40 EST Ventricular Rate:  101 PR Interval:    QRS Duration: 76 QT Interval:  317 QTC Calculation: 411 R Axis:   37 Text Interpretation:  Sinus tachycardia Probable left atrial enlargement Borderline repolarization abnormality Confirmed by Bethann Berkshire [phone]) on 04/05/2017 1:49:34 PM       Radiology Dg Chest Portable 1 View  Result Date: 2017-04-05 CLINICAL DATA:  Cough, congestion EXAM: PORTABLE CHEST 1 VIEW  COMPARISON:  01/13/2012 FINDINGS: Elevation of the left hemidiaphragm. Left base atelectasis. Right lung clear. Mild cardiomegaly. Calcifications throughout the aorta. No effusions or acute bony abnormality. IMPRESSION: Stable elevation of the left hemidiaphragm.  Left base atelectasis. Mild cardiomegaly. Aortic atherosclerosis. Electronically Signed   By: Charlett Nose M.D.   On: 05-Apr-2017 13:04    Procedures Procedures (including critical care time)  Medications Ordered in ED Medications  vancomycin (VANCOCIN) IVPB 1000 mg/200 mL premix (1,000 mg Intravenous New Bag/Given 04-05-17 1300)  piperacillin-tazobactam (ZOSYN) IVPB  3.375 g (0 g Intravenous Stopped Nov 04, 2016 1351)  sodium chloride 0.9 % bolus 1,000 mL (1,000 mLs Intravenous New Bag/Given Nov 04, 2016 1358)     Initial Impression / Assessment and Plan / ED Course  I have reviewed the triage vital signs and the nursing notes.  Pertinent labs & imaging results that were available during my care of the patient were reviewed by me and considered in my medical decision making (see chart for details). CRITICAL CARE Performed by: Bethann BerkshireJoseph Wister Hoefle Total critical care time:35 minutes Critical care time was exclusive of separately billable procedures and treating other patients. Critical care was necessary to treat or prevent imminent or life-threatening deterioration. Critical care was time spent personally by me on the following activities: development of treatment plan with patient and/or surrogate as well as nursing, discussions with consultants, evaluation of patient's response to treatment, examination of patient, obtaining history from patient or surrogate, ordering and performing treatments and interventions, ordering and review of laboratory studies, ordering and review of radiographic studies, pulse oximetry and re-evaluation of patient's condition.   Patient's daughter states the patient is a DNR and also DO NOT INTUBATE.  They do not want  any aggressive treatment.  She will be admitted to medicine    Final Clinical Impressions(s) / ED Diagnoses   Final diagnoses:  Acute respiratory failure, unspecified whether with hypoxia or hypercapnia [facility](HCC)    ED Discharge Orders    None       Bethann BerkshireZammit, Miranda Garber, MD Nov 04, 2016 1411

## 2017-03-15 NOTE — ED Triage Notes (Signed)
Pt's caregiver states pt began having congestion and cough yesterday, today much worse. Having trouble swallowing medications and applesauce, states it "seems like it gets stuck" in her throat. Rattling cough and congestion noted, weak cough.

## 2017-03-15 NOTE — ED Notes (Signed)
Lactic Acid 2.5. EDP notified.

## 2017-03-16 DIAGNOSIS — R627 Adult failure to thrive: Secondary | ICD-10-CM

## 2017-03-16 DIAGNOSIS — J9621 Acute and chronic respiratory failure with hypoxia: Secondary | ICD-10-CM

## 2017-03-20 LAB — CULTURE, BLOOD (ROUTINE X 2)
CULTURE: NO GROWTH
Culture: NO GROWTH
Special Requests: ADEQUATE

## 2017-03-29 NOTE — Progress Notes (Signed)
[employer] home removed pt from floor.

## 2017-03-29 NOTE — Progress Notes (Signed)
Late entry 1450 Pt without respirations and heart rate for approximately 5 minutes.   Family at bedside.  Second RN present.

## 2017-03-29 NOTE — Discharge Summary (Addendum)
Death Summary  [name] ZOX:[id number] DOB: [birth year] DOA: 04/06/2017  PCP: Avon Gully, MD  Admit date: Apr 06, 2017 Date of Death: 04-07-17 Time of Death: 14:50  History of present illness:  [name] is a 83 y.o. female with a history of advanced dementia, generalized deconditioning and failure to thrive, was brought to the hospital with acute respiratory failure with hypoxia related to aspiration.  Family had noted that she had "gurgling sounds" coming from her throat.  This happened approximately 5 days prior to admission, but with mucolytic's subsequently resolved.  On the day of admission, this was recurrence and she was brought to the hospital for evaluation.  She was placed on nonrebreather oxygen and had improvement of saturations.  Basic labs showed severe hyponatremia with serum sodium of 167.  She was also noted to be thrombocytopenic with a platelet count in the 80s.  Family admitted that her p.o. intake had been poor and they had noticed significant dysphagia while trying to eat and drink.  She would often hold food in her mouth.  She has been losing weight.  Her dementia has been progressing.  After an honest discussion with the patient's daughter, who is very realistic in her goals, it was decided to focus the patient's care on comfort and dignity.  Her daughter did not want any unnecessary procedures done for the patient.  She agreed with transitioning to comfort care and also agreed that giving the patient IV fluids would not change the end course.  Her daughter was agreeable to take the patient home with hospice services.  The patient was monitored in the hospital overnight and remained stable.  The following day, patient had another episode of aspiration where she was noted to be "gurgling".  She had bilateral rhonchi.  She quickly decompensated and stopped breathing.  She was found to be without respirations or pulse of 14:50.  Family was present at the bedside.  She  was pronounced dead at that time.   Final Diagnoses:    Hypernatremia   Dementia   Essential hypertension   Failure to thrive (0-17)   Dysphagia   Acute on chronic respiratory failure with hypoxia (HCC)   Aspiration into respiratory tract   Thrombocytopenia (HCC)   Lactic acidosis due to dehydration   Possible chronic kidney disease stage III  The results of significant diagnostics from this hospitalization (including imaging, microbiology, ancillary and laboratory) are listed below for reference.    Significant Diagnostic Studies: Dg Chest Portable 1 View  Result Date: Apr 06, 2017 CLINICAL DATA:  Cough, congestion EXAM: PORTABLE CHEST 1 VIEW COMPARISON:  01/13/2012 FINDINGS: Elevation of the left hemidiaphragm. Left base atelectasis. Right lung clear. Mild cardiomegaly. Calcifications throughout the aorta. No effusions or acute bony abnormality. IMPRESSION: Stable elevation of the left hemidiaphragm.  Left base atelectasis. Mild cardiomegaly. Aortic atherosclerosis. Electronically Signed   By: Charlett Nose M.D.   On: 04-06-2017 13:04    Microbiology: Recent Results (from the past 240 hour(s))  Blood Culture (routine x 2)     Status: None (Preliminary result)   Collection Time: 2017-04-06 12:36 PM  Result Value Ref Range Status   Specimen Description BLOOD  Final   Special Requests NONE  Final   Culture NO GROWTH < 24 HOURS  Final   Report Status PENDING  Incomplete  Blood Culture (routine x 2)     Status: None (Preliminary result)   Collection Time: Apr 06, 2017 12:37 PM  Result Value Ref Range Status   Specimen Description  BLOOD  Final   Special Requests NONE  Final   Culture NO GROWTH < 24 HOURS  Final   Report Status PENDING  Incomplete     Labs: Basic Metabolic Panel: Recent Labs  Lab 2016/12/29 1236  NA 167*  K 3.4*  CL 129*  CO2 31  GLUCOSE 200*  BUN 34*  CREATININE 1.14*  CALCIUM 8.8*   Liver Function Tests: Recent Labs  Lab 2016/12/29 1236  AST 31  ALT 15    ALKPHOS 64  BILITOT 0.9  PROT 6.6  ALBUMIN 3.3*   No results for input(s): LIPASE, AMYLASE in the last 168 hours. No results for input(s): AMMONIA in the last 168 hours. CBC: Recent Labs  Lab 2016/12/29 1236  WBC 12.8*  NEUTROABS 10.7*  HGB 13.8  HCT 46.5*  MCV 102.2*  PLT 84*   Cardiac Enzymes: Recent Labs  Lab 2016/12/29 1236  TROPONINI 0.05*   D-Dimer No results for input(s): DDIMER in the last 72 hours. BNP: Invalid input(s): POCBNP CBG: No results for input(s): GLUCAP in the last 168 hours. Anemia work up No results for input(s): VITAMINB12, FOLATE, FERRITIN, TIBC, IRON, RETICCTPCT in the last 72 hours. Urinalysis    Component Value Date/Time   COLORURINE YELLOW 05/28/2010 1300   APPEARANCEUR CLOUDY (A) 05/28/2010 1300   LABSPEC 1.010 05/28/2010 1300   PHURINE 6.5 05/28/2010 1300   GLUCOSEU NEGATIVE 07/20/2007 1121   HGBUR NEGATIVE 05/28/2010 1300   BILIRUBINUR NEGATIVE 05/28/2010 1300   KETONESUR TRACE (A) 05/28/2010 1300   PROTEINUR NEGATIVE 05/28/2010 1300   UROBILINOGEN 0.2 05/28/2010 1300   NITRITE NEGATIVE 05/28/2010 1300   LEUKOCYTESUR  05/28/2010 1300    NEGATIVE MICROSCOPIC NOT DONE ON URINES WITH NEGATIVE PROTEIN, BLOOD, LEUKOCYTES, NITRITE, OR GLUCOSE <1000 mg/dL.   Sepsis Labs Invalid input(s): PROCALCITONIN,  WBC,  LACTICIDVEN     SIGNED:  Erick BlinksJehanzeb Memon, MD  [facility] 03/10/2017, 6:51 PM Pager   If 7PM-7AM, please contact night-coverage [email or web address] Password [email or web address]

## 2017-03-29 DEATH — deceased
